# Patient Record
Sex: Female | Born: 1955 | Race: Black or African American | Hispanic: No | Marital: Single | Smoking: Never smoker
Health system: Southern US, Community
[De-identification: ages and names within clinical notes are randomized; demographics above are authoritative.]

## PROBLEM LIST (undated history)

## (undated) DIAGNOSIS — L709 Acne, unspecified: Secondary | ICD-10-CM

## (undated) DIAGNOSIS — C541 Malignant neoplasm of endometrium: Secondary | ICD-10-CM

## (undated) DIAGNOSIS — L309 Dermatitis, unspecified: Secondary | ICD-10-CM

## (undated) HISTORY — DX: Dermatitis, unspecified: L30.9

## (undated) HISTORY — DX: Acne, unspecified: L70.9

## (undated) HISTORY — PX: TUBAL LIGATION: SHX77

---

## 1999-09-30 ENCOUNTER — Encounter: Payer: Self-pay | Admitting: Family Medicine

## 1999-09-30 ENCOUNTER — Inpatient Hospital Stay (HOSPITAL_COMMUNITY): Admission: AD | Admit: 1999-09-30 | Discharge: 1999-09-30 | Payer: Self-pay | Admitting: Family Medicine

## 2000-01-28 ENCOUNTER — Ambulatory Visit (HOSPITAL_COMMUNITY): Admission: RE | Admit: 2000-01-28 | Discharge: 2000-01-28 | Payer: Self-pay | Admitting: *Deleted

## 2001-08-20 ENCOUNTER — Other Ambulatory Visit: Admission: RE | Admit: 2001-08-20 | Discharge: 2001-08-20 | Payer: Self-pay | Admitting: *Deleted

## 2001-12-10 ENCOUNTER — Ambulatory Visit (HOSPITAL_COMMUNITY): Admission: RE | Admit: 2001-12-10 | Discharge: 2001-12-10 | Payer: Self-pay | Admitting: Family Medicine

## 2001-12-10 ENCOUNTER — Encounter: Payer: Self-pay | Admitting: Family Medicine

## 2002-08-26 ENCOUNTER — Other Ambulatory Visit: Admission: RE | Admit: 2002-08-26 | Discharge: 2002-08-26 | Payer: Self-pay | Admitting: *Deleted

## 2003-09-01 ENCOUNTER — Other Ambulatory Visit: Admission: RE | Admit: 2003-09-01 | Discharge: 2003-09-01 | Payer: Self-pay | Admitting: Obstetrics and Gynecology

## 2004-06-24 ENCOUNTER — Emergency Department (HOSPITAL_COMMUNITY): Admission: EM | Admit: 2004-06-24 | Discharge: 2004-06-24 | Payer: Self-pay | Admitting: Emergency Medicine

## 2004-07-19 ENCOUNTER — Ambulatory Visit (HOSPITAL_COMMUNITY): Admission: RE | Admit: 2004-07-19 | Discharge: 2004-07-19 | Payer: Self-pay | Admitting: Family Medicine

## 2006-05-12 IMAGING — CR DG ABDOMEN ACUTE W/ 1V CHEST
3 series · 3 of 3 positions shown · non-contrast
Comparison: none

CLINICAL DATA: Abdominal bloating and constipation.
 ACUTE ABDOMINAL STUDY WITH CHEST ? 07/19/04 AT 1447 HOURS:

[view not recorded (1 of 3)]
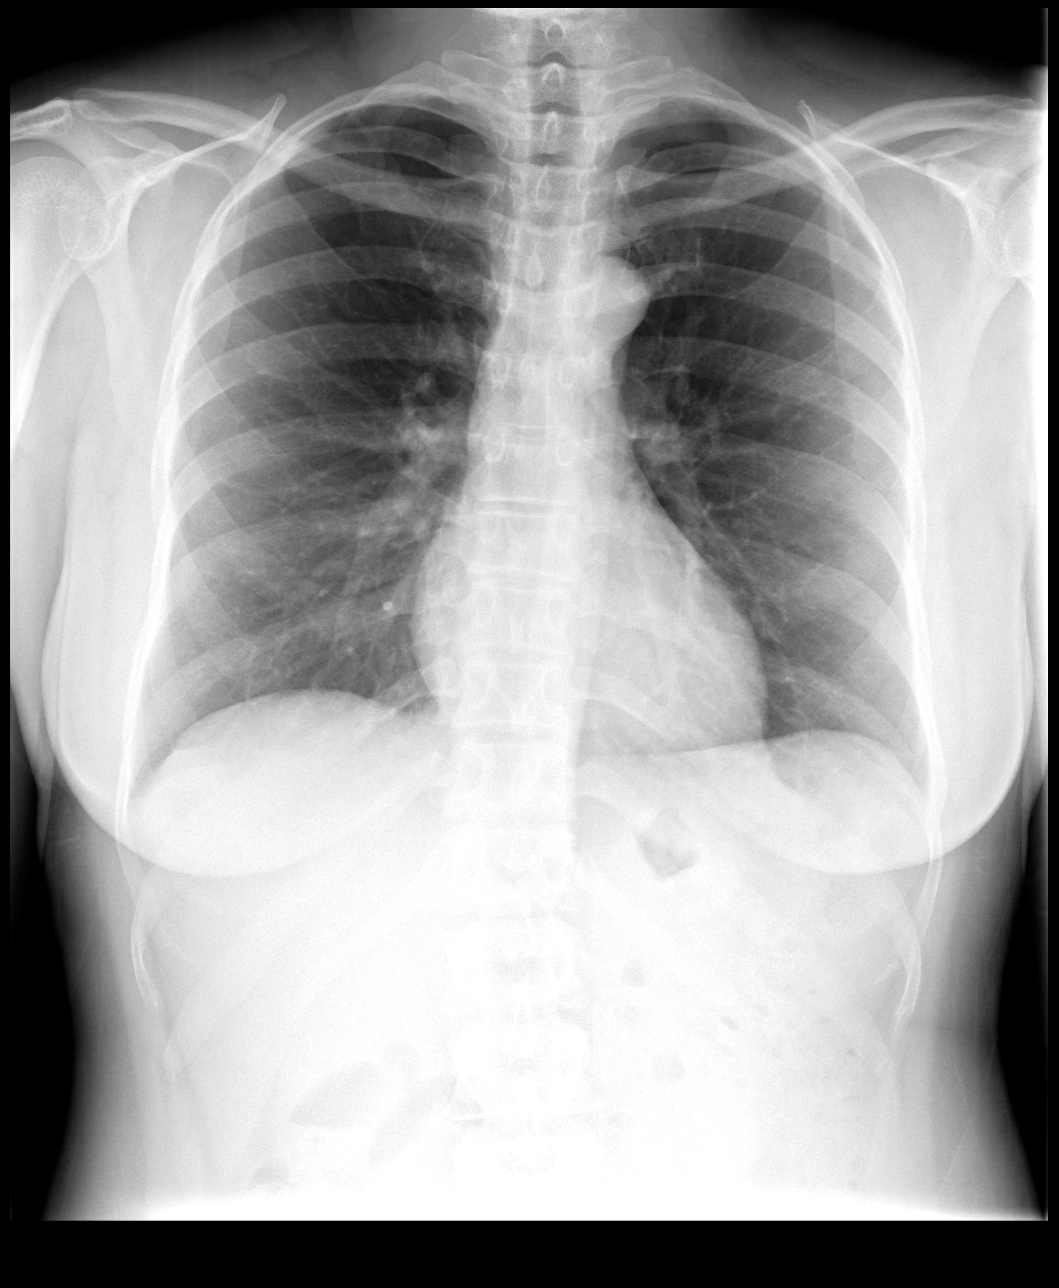

[view not recorded (2 of 3)]
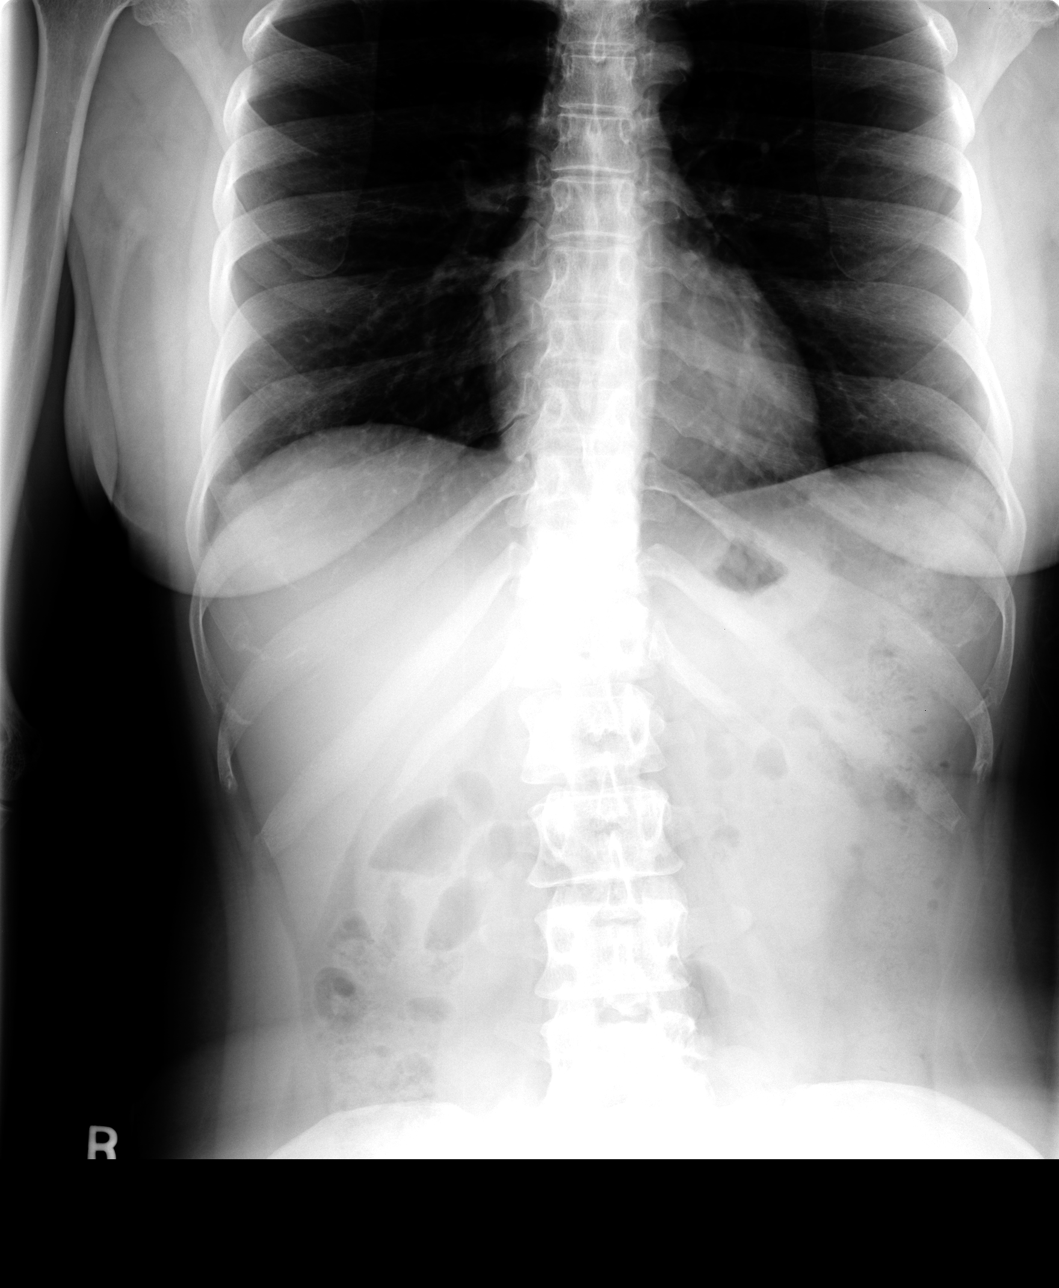

[view not recorded (3 of 3)]
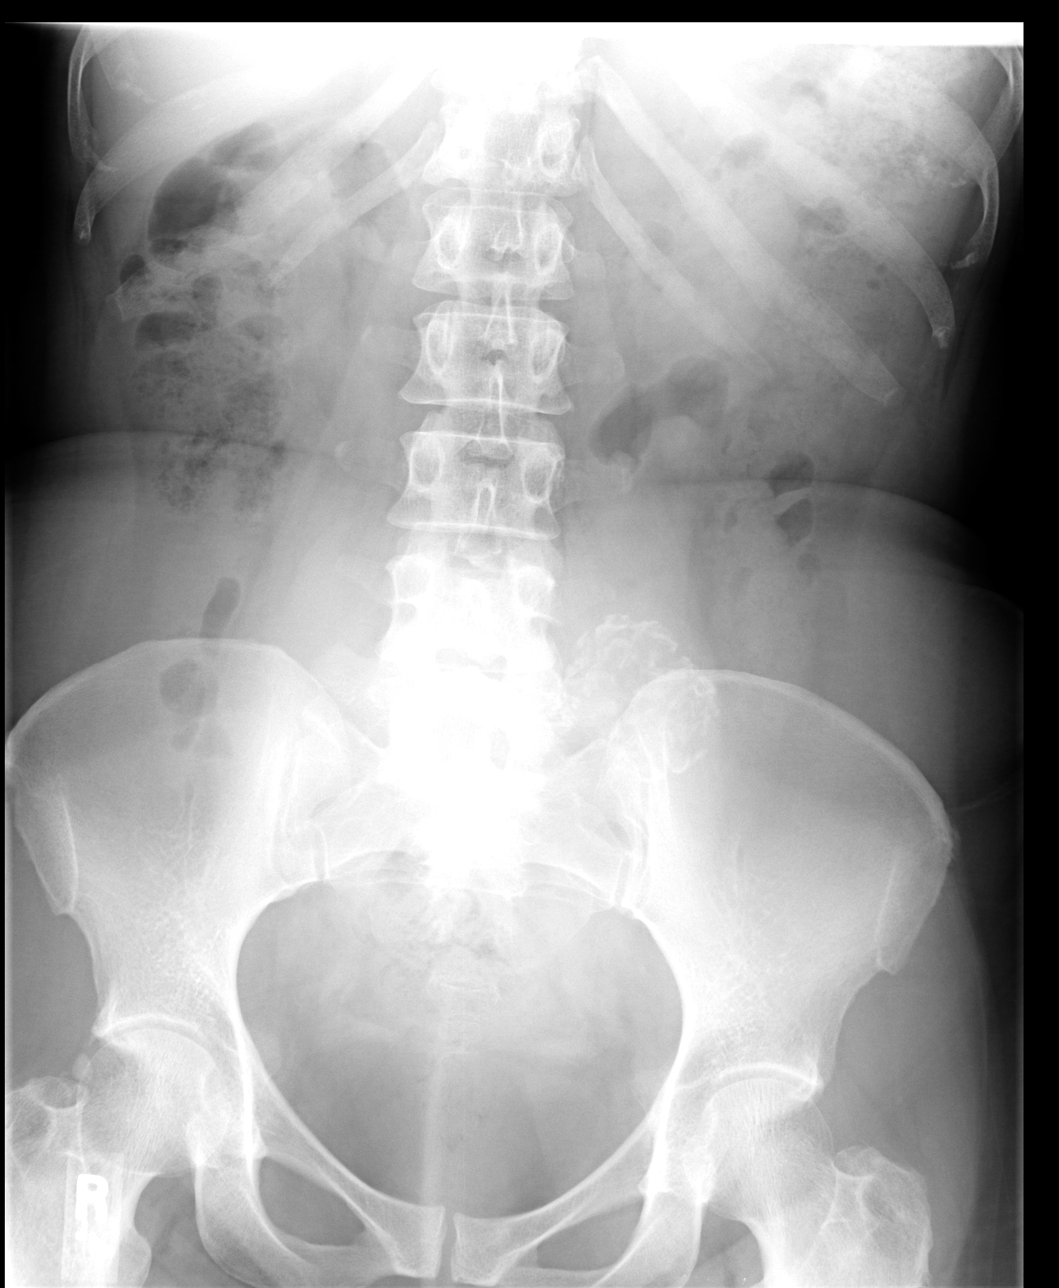

[3 of 3 positions shown; findings below may reference images not displayed]

FINDINGS: The lungs are clear.
 Gas and stool are seen in the colon.  There are no disproportionately dilated loops of bowel or air-fluid levels.  There is no free peritoneal gas.  A calcified 4-5 cm mass projects over the left lower quadrant favored to represent a calcified fibroid.  Other calcified intra-abdominal masses are not excluded.  Further characterization with ultrasound or CT can be performed.
IMPRESSION: 1.  Normal bowel gas pattern.
 2.  Calcified mass in the left pelvis.  Considerations would include calcified fibroid or other calcified mass in the abdomen and pelvis.  Further characterization with ultrasound can be performed.

## 2008-07-01 ENCOUNTER — Ambulatory Visit: Payer: Self-pay | Admitting: Family Medicine

## 2008-07-16 ENCOUNTER — Encounter (INDEPENDENT_AMBULATORY_CARE_PROVIDER_SITE_OTHER): Payer: Self-pay | Admitting: *Deleted

## 2009-01-07 ENCOUNTER — Encounter: Payer: Self-pay | Admitting: Family Medicine

## 2009-11-23 ENCOUNTER — Ambulatory Visit: Payer: Self-pay | Admitting: Family Medicine

## 2009-11-23 DIAGNOSIS — IMO0001 Reserved for inherently not codable concepts without codable children: Secondary | ICD-10-CM | POA: Insufficient documentation

## 2009-11-23 DIAGNOSIS — R5383 Other fatigue: Secondary | ICD-10-CM

## 2009-11-23 DIAGNOSIS — R5381 Other malaise: Secondary | ICD-10-CM | POA: Insufficient documentation

## 2010-01-08 ENCOUNTER — Encounter: Payer: Self-pay | Admitting: Family Medicine

## 2010-04-30 ENCOUNTER — Encounter: Payer: Self-pay | Admitting: Family Medicine

## 2010-07-11 LAB — CONVERTED CEMR LAB
ALT: 17 units/L (ref 0–35)
ANA Titer 1: 1:320 {titer} — ABNORMAL HIGH
AST: 23 units/L (ref 0–37)
Albumin: 3.8 g/dL (ref 3.5–5.2)
Albumin: 4.2 g/dL (ref 3.5–5.2)
Alkaline Phosphatase: 45 units/L (ref 39–117)
Alkaline Phosphatase: 63 units/L (ref 39–117)
Anti Nuclear Antibody(ANA): POSITIVE — AB
BUN: 14 mg/dL (ref 6–23)
Basophils Absolute: 0 10*3/uL (ref 0.0–0.1)
Basophils Absolute: 0.1 10*3/uL (ref 0.0–0.1)
Basophils Relative: 1.1 % (ref 0.0–3.0)
Bilirubin, Direct: 0.1 mg/dL (ref 0.0–0.3)
CO2: 29 meq/L (ref 19–32)
CO2: 30 meq/L (ref 19–32)
Calcium: 9.7 mg/dL (ref 8.4–10.5)
Calcium: 9.8 mg/dL (ref 8.4–10.5)
Chloride: 103 meq/L (ref 96–112)
Chloride: 107 meq/L (ref 96–112)
Cholesterol: 198 mg/dL (ref 0–200)
Creatinine, Ser: 0.8 mg/dL (ref 0.4–1.2)
Creatinine, Ser: 0.9 mg/dL (ref 0.4–1.2)
Eosinophils Absolute: 0.1 10*3/uL (ref 0.0–0.7)
Eosinophils Absolute: 0.2 10*3/uL (ref 0.0–0.7)
Eosinophils Relative: 1.9 % (ref 0.0–5.0)
Ferritin: 9.7 ng/mL — ABNORMAL LOW (ref 10.0–291.0)
Folate: 16.1 ng/mL
GFR calc Af Amer: 97 mL/min
GFR calc non Af Amer: 80 mL/min
Glucose, Bld: 83 mg/dL (ref 70–99)
Glucose, Bld: 93 mg/dL (ref 70–99)
HCT: 31.6 % — ABNORMAL LOW (ref 36.0–46.0)
HDL: 53.2 mg/dL (ref >39.00)
HDL: 58.8 mg/dL (ref >39.0)
Hemoglobin: 10.1 g/dL — ABNORMAL LOW (ref 12.0–15.0)
Hemoglobin: 13.3 g/dL (ref 12.0–15.0)
Iron: 49 ug/dL (ref 42–145)
LDL Cholesterol: 103 mg/dL — ABNORMAL HIGH (ref 0–99)
Lymphocytes Relative: 33.9 % (ref 12.0–46.0)
Lymphocytes Relative: 35 % (ref 12.0–46.0)
MCHC: 32.1 g/dL (ref 30.0–36.0)
MCHC: 34.4 g/dL (ref 30.0–36.0)
MCV: 74.4 fL — ABNORMAL LOW (ref 78.0–100.0)
MCV: 92.6 fL (ref 78.0–100.0)
Monocytes Absolute: 0.6 10*3/uL (ref 0.1–1.0)
Monocytes Absolute: 0.7 10*3/uL (ref 0.1–1.0)
Monocytes Relative: 9.9 % (ref 3.0–12.0)
Neutro Abs: 3 10*3/uL (ref 1.4–7.7)
Neutro Abs: 3.3 10*3/uL (ref 1.4–7.7)
Neutrophils Relative %: 52.1 % (ref 43.0–77.0)
Pap Smear: NORMAL
Pap Smear: NORMAL
Platelets: 306 10*3/uL (ref 150–400)
Potassium: 4 meq/L (ref 3.5–5.1)
RBC: 4.25 M/uL (ref 3.87–5.11)
RDW: 13.8 % (ref 11.5–14.6)
RDW: 16.6 % — ABNORMAL HIGH (ref 11.5–14.6)
Sodium: 137 meq/L (ref 135–145)
Sodium: 142 meq/L (ref 135–145)
TSH: 0.97 microintl units/mL (ref 0.35–5.50)
TSH: 1.24 microintl units/mL (ref 0.35–5.50)
Total Bilirubin: 0.9 mg/dL (ref 0.3–1.2)
Total CHOL/HDL Ratio: 3.4
Total Protein: 7.4 g/dL (ref 6.0–8.3)
Total Protein: 7.7 g/dL (ref 6.0–8.3)
Triglycerides: 180 mg/dL — ABNORMAL HIGH (ref 0–149)
Triglycerides: 77 mg/dL (ref 0.0–149.0)
VLDL: 36 mg/dL (ref 0–40)
Vit D, 25-Hydroxy: 35 ng/mL (ref 30–89)
Vitamin B-12: 598 pg/mL (ref 211–911)
WBC: 5.8 10*3/uL (ref 4.5–10.5)

## 2010-07-13 NOTE — Assessment & Plan Note (Signed)
Summary: cpe & lab/cbs   Vital Signs:  Patient profile:   55 year old female Height:      60.75 inches Weight:      159 pounds BMI:     30.40 Pulse rate:   86 / minute Pulse rhythm:   regular BP sitting:   110 / 70  (left arm) Cuff size:   regular  Vitals Entered By: Army Fossa CMA (November 23, 2009 8:32 AM) CC: CPX, labwork, no pap   History of Present Illness: Pt here for cpe, labs --- pt sees Dr Rosemary Holms for gyn exam ..   Pt only complaints is feeling tired all the time and muscle aches in legs.  No other complaints.  Preventive Screening-Counseling & Management  Alcohol-Tobacco     Alcohol drinks/day: 0     Smoking Status: never     Passive Smoke Exposure: no  Caffeine-Diet-Exercise     Caffeine use/day: 2     Does Patient Exercise: yes     Type of exercise: walking,biking     Exercise (avg: min/session): 30-60     Times/week: <3  Hep-HIV-STD-Contraception     HIV Risk: no     Dental Visit-last 6 months yes     Dental Care Counseling: not indicated; dental care within six months     SBE monthly: no     SBE Education/Counseling: to perform regular SBE     Sun Exposure-Excessive: occasionally  Safety-Violence-Falls     Seat Belt Use: 100      Sexual History:  currently monogamous and single.        Drug Use:  never.    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  Past Medical History: Reviewed history from 07/01/2008 and no changes required. none  Past Surgical History: Reviewed history from 07/01/2008 and no changes required. Tubal ligation  Family History: Reviewed history from 07/01/2008 and no changes required. breast CA-sister,aunt  Social History: Reviewed history from 07/01/2008 and no changes required. Occupation:  Accounting --Scientist, physiological Single Never Smoked Alcohol use-no Drug use-no Regular exercise-yes Dental Care w/in 6 mos.:  yes Sexual History:  currently monogamous, single Drug Use:  never Ethnicity:  Black  Review of Systems      See HPI General:  Denies chills, fatigue, fever, loss of appetite, malaise, sleep disorder, sweats, weakness, and weight loss. Eyes:  Denies blurring, discharge, double vision, eye irritation, eye pain, halos, itching, light sensitivity, red eye, vision loss-1 eye, and vision loss-both eyes; optho q1y. ENT:  Denies decreased hearing, difficulty swallowing, ear discharge, earache, hoarseness, nasal congestion, nosebleeds, postnasal drainage, ringing in ears, sinus pressure, and sore throat. CV:  Denies bluish discoloration of lips or nails, chest pain or discomfort, difficulty breathing at night, difficulty breathing while lying down, fainting, fatigue, leg cramps with exertion, lightheadness, near fainting, palpitations, shortness of breath with exertion, swelling of feet, swelling of hands, and weight gain. Resp:  Denies chest discomfort, chest pain with inspiration, cough, coughing up blood, excessive snoring, hypersomnolence, morning  headaches, pleuritic, shortness of breath, sputum productive, and wheezing. GI:  Denies abdominal pain, bloody stools, change in bowel habits, constipation, dark tarry stools, diarrhea, excessive appetite, gas, hemorrhoids, indigestion, loss of appetite, and nausea. GU:  Denies abnormal vaginal bleeding, decreased libido, discharge, dysuria,  hematuria, incontinence, nocturia, urinary frequency, and urinary hesitancy. MS:  Denies joint pain, joint redness, joint swelling, loss of strength, low back pain, mid back pain, muscle aches, muscle , cramps, muscle weakness, stiffness, and thoracic pain. Derm:  Denies changes in color of skin, changes in nail beds, dryness, excessive perspiration, flushing, hair loss, insect bite(s), itching, lesion(s), poor wound healing, and rash. Neuro:  Denies brief paralysis, difficulty with concentration, disturbances in coordination, falling down, headaches, inability to speak, memory loss, numbness, poor balance, seizures, sensation of room spinning, tingling, tremors, visual disturbances, and weakness. Psych:  Denies alternate hallucination ( auditory/visual), anxiety, depression, easily angered, easily tearful, irritability, mental problems, panic attacks, sense of great danger, suicidal thoughts/plans, thoughts of violence, unusual visions or sounds, and thoughts /plans of harming others. Endo:  Denies cold intolerance, excessive hunger, excessive thirst, excessive urination, heat intolerance, polyuria, and weight change. Heme:  Denies abnormal bruising, bleeding, enlarge lymph nodes, fevers, pallor, and skin discoloration. Allergy:  Denies hives or rash, itching eyes, persistent infections, seasonal allergies, and sneezing.  Physical Exam  General:  Well-developed,well-nourished,in no acute distress; alert,appropriate and cooperative throughout examination Head:  Normocephalic and atraumatic without obvious abnormalities. No apparent alopecia or balding. Eyes:  vision grossly intact, pupils equal, pupils round, pupils reactive to light, and no injection.   Ears:  External ear exam shows no significant lesions or deformities.  Otoscopic examination reveals clear canals, tympanic membranes are intact bilaterally without bulging, retraction, inflammation or discharge. Hearing is grossly normal  bilaterally. Nose:  External nasal examination shows no deformity or inflammation. Nasal mucosa are pink and moist without lesions or exudates. Mouth:  Oral mucosa and oropharynx without lesions or exudates.  Teeth in good repair. Neck:  No deformities, masses, or tenderness noted.no carotid bruits.   Breasts:  gyn Lungs:  Normal respiratory effort, chest expands symmetrically. Lungs are clear to auscultation, no crackles or wheezes. Heart:  normal rate and no murmur.   Abdomen:  Bowel sounds positive,abdomen soft and non-tender without masses, organomegaly or hernias noted. Rectal:  gyn Genitalia:  gyn Msk:  normal ROM, no joint tenderness, no joint swelling, no joint warmth, no redness over joints, no joint deformities, no joint instability, and no crepitation.   Pulses:  R posterior tibial normal, R dorsalis pedis normal, R carotid normal, L posterior tibial normal, L dorsalis pedis normal, and L carotid normal.   Extremities:  No clubbing, cyanosis, edema, or deformity noted with normal full range of motion of all joints.   Neurologic:  No cranial nerve deficits noted. Station and gait are normal. Plantar reflexes are down-going bilaterally. DTRs are symmetrical throughout. Sensory, motor and coordinative functions appear intact. Skin:  Intact without suspicious lesions or rashes Cervical Nodes:  No lymphadenopathy noted Axillary Nodes:  No palpable lymphadenopathy Psych:  Cognition and judgment appear intact. Alert and cooperative with normal attention span and concentration. No apparent delusions, illusions, hallucinations   Impression & Recommendations:  Problem # 1:  PREVENTIVE HEALTH CARE (ICD-V70.0) take 1500 Orders: Venipuncture (96295) TLB-Lipid Panel (80061-LIPID) TLB-BMP (Basic Metabolic Panel-BMET) (80048-METABOL) TLB-CBC Platelet - w/Differential (85025-CBCD) TLB-Hepatic/Liver Function Pnl (80076-HEPATIC) TLB-TSH (Thyroid Stimulating Hormone) (84443-TSH) TLB-B12 +  Folate Pnl (28413_24401-U27/OZD) TLB-IBC Pnl (Iron/FE;Transferrin) (83550-IBC) TLB-Ferritin (82728-FER) EKG w/  Interpretation (93000)  Problem # 2:  MYALGIA (ICD-729.1)  Orders: T-Vitamin D (25-Hydroxy) (96045-40981) T-Antinuclear Antib (ANA) (19147-82956) TLB-Rheumatoid Factor (RA) (21308-MV) TLB-Sedimentation Rate (ESR) (85652-ESR) EKG w/ Interpretation (93000)  Problem # 3:  FATIGUE (ICD-780.79)  Orders: TLB-B12 + Folate Pnl (78469_62952-W41/LKG) TLB-IBC Pnl (Iron/FE;Transferrin) (83550-IBC) TLB-Ferritin (82728-FER) EKG w/ Interpretation (93000) yyyyy  Patient Instructions: 1)  It is important that you exercise reguarly at least 20 minutes 5 times a week. If you develop chest pain, have severe difficulty breathing, or feel very tired, stop exercising immediately and seek medical attention.  2)  Take calcium +vitamin D daily. ---- 1200-1500 mg calcium and 1000u vita D3   EKG  Procedure date:  11/23/2009  Findings:      Normal sinus rhythm with rate of:  63 bpm   Last PAP:  Normal (08/29/2007 10:38:23 AM) PAP Result Date:  11/26/2008 PAP Result:  normal PAP Next Due:  1 yr Last Mammogram:  Normal Bilateral (10/24/2007 10:38:53 AM) Mammogram Result Date:  11/26/2008 Mammogram Result:  normal Mammogram Next Due:  1 yr  Appended Document: cpe & lab/cbs  Laboratory Results   Urine Tests   Date/Time Reported: November 23, 2009 10:14 AM   Routine Urinalysis   Color: yellow Appearance: Clear Glucose: negative   (Normal Range: Negative) Bilirubin: negative   (Normal Range: Negative) Ketone: negative   (Normal Range: Negative) Spec. Gravity: 1.020   (Normal Range: 1.003-1.035) Blood: negative   (Normal Range: Negative) pH: 6.5   (Normal Range: 5.0-8.0) Protein: negative   (Normal Range: Negative) Urobilinogen: negative   (Normal Range: 0-1) Nitrite: negative   (Normal Range: Negative) Leukocyte Esterace: negative   (Normal Range: Negative)    Comments: Floydene Flock  November 23, 2009 10:14 AM

## 2010-07-16 NOTE — Consult Note (Signed)
Summary: [facility]  [facility]   Imported By: Lanelle Bal 05/15/2010 11:34:02  _____________________________________________________________________  External Attachment:    Type:   Image     Comment:   External Document

## 2010-10-29 NOTE — Op Note (Signed)
[facility]  Patient:    Elizabeth [name], Elizabeth [name]                      MRN: [id number] Proc. Date: 01/28/00 Adm. Date:  [id number] Disc. Date: [id number] Attending:  Pleas Koch                           Operative Report  PREOPERATIVE DIAGNOSES:       1. Fibroid uterus.                               2. Desires elective sterilization.  POSTOPERATIVE DIAGNOSES:      1. Fibroid uterus.                               2. Desires elective sterilization.  OPERATION:                    Open laparoscopic tubal ligation.  SURGEON:                      Georgina Peer, M.D.  ANESTHESIA:                   General.  ESTIMATED BLOOD LOSS:         Less than 25 cc.  COMPLICATIONS:                None.  INDICATIONS:                  This is a 55 year old gravida 2, para 1, who desires no further children.  She has a history of fibroids which are approximately 14-16 weeks size, however, the patient is having no symptoms, no pressure, no abnormal periods and no pain.  She therefore did not wish to wait to see if the fibroids would become symptomatic.  She did desire tubal ligation knowing that she might have to have surgery again in the future.  DESCRIPTION OF PROCEDURE:     The patient was taken to the operating room and given a general anesthetic, placed in the dorsal lithotomy position.  She was prepped and draped in the normal sterile manner.  A uterine manipulator was placed through the uterus.  The uterus was about 16 weeks size.  Her bladder was emptied with a catheter.  A curvilinear subumbilical incision was made after 5 cc of 0.25% Marcaine had been injected into the skin. The subcutaneous tissue was transected.  The fascia was identified, elevated and transected. The peritoneum was identified and transected. The Hasson laparoscopic trocar was placed.  There were no adhesions around the abdominal wall. Pneumoperitoneum was created.  There were multiple  fibroids upon visualization of the pelvic structures.  The left tube was traced to its fimbriated end, cauterized until blanching and no current passed through the tube.  The right tube was convoluted and attached to the ovary posteriorly with thin filmy adhesions.  It was freed.  The fimbriated tube was identified, the mid portion elevated, cauterized to blanching, and no current passed through the tube along the 2 cm segments. Photodocumentation of this was made.  A second suprapubic port was made to aid in manipulation of the fibroid uterus. Multiple fibroids were noted.  The ovaries did not appear enlarged.  There was no  other pathology noted.  The procedure was terminated with the scope and trocars removed.  The fascia was closed with 0 Vicryl and the subcuticular Dexon suture to close the skin incisions.  The vaginal instruments were removed.  The patient had a normal sponge, needle and instrument count which was correct. She was returned to the recovery area in stable condition.DD: 01/28/00 TD:  01/30/00 Job: [id number]

## 2010-11-24 ENCOUNTER — Encounter: Payer: Self-pay | Admitting: Family Medicine

## 2010-11-29 ENCOUNTER — Ambulatory Visit (INDEPENDENT_AMBULATORY_CARE_PROVIDER_SITE_OTHER): Payer: 59 | Admitting: Family Medicine

## 2010-11-29 ENCOUNTER — Encounter: Payer: Self-pay | Admitting: Family Medicine

## 2010-11-29 VITALS — BP 118/82 | HR 68 | Temp 97.2°F | Ht 61.0 in | Wt 164.6 lb

## 2010-11-29 DIAGNOSIS — Z0000 Encounter for general adult medical examination without abnormal findings: Secondary | ICD-10-CM

## 2010-11-29 DIAGNOSIS — D649 Anemia, unspecified: Secondary | ICD-10-CM | POA: Insufficient documentation

## 2010-11-29 DIAGNOSIS — R42 Dizziness and giddiness: Secondary | ICD-10-CM | POA: Insufficient documentation

## 2010-11-29 LAB — IBC PANEL
Iron: 134 ug/dL (ref 42–145)
Transferrin: 305.7 mg/dL (ref 212.0–360.0)

## 2010-11-29 LAB — CBC WITH DIFFERENTIAL/PLATELET
Basophils Absolute: 0 10*3/uL (ref 0.0–0.1)
Eosinophils Absolute: 0.1 10*3/uL (ref 0.0–0.7)
Lymphocytes Relative: 43 % (ref 12.0–46.0)
MCHC: 34.5 g/dL (ref 30.0–36.0)
Neutro Abs: 2.7 10*3/uL (ref 1.4–7.7)
Neutrophils Relative %: 44.5 % (ref 43.0–77.0)
RDW: 12.3 % (ref 11.5–14.6)

## 2010-11-29 LAB — BASIC METABOLIC PANEL
Calcium: 9.6 mg/dL (ref 8.4–10.5)
GFR: 103.14 mL/min (ref >60.00)
Potassium: 4.1 mEq/L (ref 3.5–5.1)
Sodium: 139 mEq/L (ref 135–145)

## 2010-11-29 LAB — POCT URINALYSIS DIPSTICK
Bilirubin, UA: NEGATIVE
Blood, UA: NEGATIVE
Glucose, UA: NEGATIVE
Ketones, UA: NEGATIVE
Leukocytes, UA: NEGATIVE
Nitrite, UA: NEGATIVE
pH, UA: 7

## 2010-11-29 LAB — VITAMIN B12: Vitamin B-12: 271 pg/mL (ref 211–911)

## 2010-11-29 LAB — HEPATIC FUNCTION PANEL
Albumin: 4.6 g/dL (ref 3.5–5.2)
Alkaline Phosphatase: 68 U/L (ref 39–117)
Bilirubin, Direct: 0.2 mg/dL (ref 0.0–0.3)

## 2010-11-29 LAB — LIPID PANEL
HDL: 52.6 mg/dL (ref >39.00)
VLDL: 23.2 mg/dL (ref 0.0–40.0)

## 2010-11-29 NOTE — Patient Instructions (Signed)

## 2010-11-29 NOTE — Progress Notes (Signed)
Subjective:     Elizabeth [name] is a 55 y.o. female and is here for a comprehensive physical exam. The patient reports no problems.  History   Social History  . Marital Status: Single    Spouse Name: N/A    Number of Children: 1  . 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 Smoking status: Never Smoker   . Smokeless tobacco: Never Used  . Alcohol Use: No  . Drug Use: No  . Sexually Active: No   Other Topics Concern  . Not on file   Social History Narrative  . No narrative on file   Health Maintenance  Topic Date Due  . Pap Smear  10/21/1973  . Mammogram  10/21/2005  . Colonoscopy  10/21/2005  . Influenza Vaccine  03/14/2011  . Tetanus/tdap  05/26/2015    The following portions of the patient's history were reviewed and updated as appropriate: allergies, current medications, past family history, past medical history, past social history, past surgical history and problem list. Past Medical History  Diagnosis Date  . Acne    Past Surgical History  Procedure Date  . Tubal ligation    History   Social History  . Marital Status: Single    Spouse Name: N/A    Number of Children: 1  . 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 Smoking status: Never Smoker   . Smokeless tobacco: Never Used  . Alcohol Use: No  . Drug Use: No  . Sexually Active: No   Other Topics Concern  . Not on file   Social History Narrative  . No narrative on file   Family History  Problem Relation Age of Onset  . Breast cancer Sister   . Cancer Sister 25    breast  . Breast cancer Maternal Aunt   . Hepatitis Mother 53    hep C  . Heart disease Father 72    MI  . Aneurysm Maternal Grandfather        Review of Systems Review of Systems  Constitutional: Negative for activity change, appetite change and fatigue.  HENT: Negative for hearing  loss, congestion, tinnitus and ear discharge.  dentist q42m Eyes: Negative for visual disturbance (see optho q1y -- vision corrected to 20/20 with glasses).  Respiratory: Negative for cough, chest tightness and shortness of breath.   Cardiovascular: Negative for chest pain, palpitations and leg swelling.  Gastrointestinal: Negative for abdominal pain, diarrhea, constipation and abdominal distention.  Genitourinary: Negative for urgency, frequency, decreased urine volume and difficulty urinating.  Musculoskeletal: Negative for back pain, arthralgias and gait problem.  Skin: Negative for color change, pallor and rash.  Neurological: Negative for dizziness, light-headedness, numbness and headaches.  Hematological: Negative for adenopathy. Does not bruise/bleed easily.  Psychiatric/Behavioral: Negative for suicidal ideas, confusion, sleep disturbance, self-injury, dysphoric mood, decreased concentration and agitation.  Derm--[facility]-Dr Rosemary Holms    Objective:    BP 118/82  Pulse 68  Temp(Src) 97.2 F (36.2 C) (Oral)  Ht 5\' 1"  (1.549 m)  Wt 164 lb 9.6 oz (74.662 kg)  BMI 31.10 kg/m2  SpO2 97% General appearance: alert, cooperative, appears stated age and no distress Head: Normocephalic, without obvious abnormality, atraumatic Eyes: conjunctivae/corneas clear. PERRL, EOM's intact. Fundi benign. Ears: normal TM's and external ear canals both ears Nose: Nares normal. Septum midline. Mucosa normal.  No drainage or sinus tenderness. Throat: lips, mucosa, and tongue normal; teeth and gums normal Neck: no adenopathy, no carotid bruit, no JVD, supple, symmetrical, trachea midline and thyroid not enlarged, symmetric, no tenderness/mass/nodules Lungs: clear to auscultation bilaterally Breasts: gyn Heart: regular rate and rhythm, S1, S2 normal, no murmur, click, rub or gallop Abdomen: soft, non-tender; bowel sounds normal; no masses,  no organomegaly Pelvic: gyn Extremities: extremities normal,  atraumatic, no cyanosis or edema Pulses: 2+ and symmetric Skin: Skin color, texture, turgor normal. No rashes or lesions Lymph nodes: Cervical, supraclavicular, and axillary nodes normal. Neurologic: Grossly normal psych--no depression , no anxiety,  not suicidal    Assessment:    Healthy female exam.     acne--per derm Dizziness--check labs---hx anemia Plan:    ghm utd Check fasting labs See After Visit Summary for Counseling Recommendations

## 2010-12-06 ENCOUNTER — Encounter: Payer: Self-pay | Admitting: *Deleted

## 2010-12-13 ENCOUNTER — Encounter: Payer: Self-pay | Admitting: Family Medicine

## 2011-01-28 ENCOUNTER — Encounter: Payer: Self-pay | Admitting: Family Medicine

## 2011-10-25 DIAGNOSIS — Q178 Other specified congenital malformations of ear: Secondary | ICD-10-CM | POA: Insufficient documentation

## 2012-01-26 ENCOUNTER — Encounter: Payer: Self-pay | Admitting: Family Medicine

## 2012-04-03 ENCOUNTER — Encounter: Payer: Self-pay | Admitting: Family Medicine

## 2012-04-10 ENCOUNTER — Encounter: Payer: Self-pay | Admitting: Family Medicine

## 2012-04-10 ENCOUNTER — Ambulatory Visit (INDEPENDENT_AMBULATORY_CARE_PROVIDER_SITE_OTHER): Payer: 59 | Admitting: Family Medicine

## 2012-04-10 VITALS — BP 120/82 | HR 66 | Temp 98.0°F | Ht 61.0 in | Wt 168.6 lb

## 2012-04-10 DIAGNOSIS — Z0000 Encounter for general adult medical examination without abnormal findings: Secondary | ICD-10-CM

## 2012-04-10 LAB — BASIC METABOLIC PANEL
Chloride: 102 mEq/L (ref 96–112)
Potassium: 3.2 mEq/L — ABNORMAL LOW (ref 3.5–5.1)

## 2012-04-10 LAB — CBC WITH DIFFERENTIAL/PLATELET
Basophils Relative: 0.7 % (ref 0.0–3.0)
Eosinophils Relative: 2.1 % (ref 0.0–5.0)
HCT: 43 % (ref 36.0–46.0)
Lymphs Abs: 2.4 10*3/uL (ref 0.7–4.0)
MCV: 91.3 fl (ref 78.0–100.0)
Monocytes Absolute: 0.6 10*3/uL (ref 0.1–1.0)
Neutrophils Relative %: 48.3 % (ref 43.0–77.0)
RBC: 4.71 Mil/uL (ref 3.87–5.11)
WBC: 6.1 10*3/uL (ref 4.5–10.5)

## 2012-04-10 LAB — HEPATIC FUNCTION PANEL
ALT: 36 U/L — ABNORMAL HIGH (ref 0–35)
AST: 38 U/L — ABNORMAL HIGH (ref 0–37)
Bilirubin, Direct: 0.2 mg/dL (ref 0.0–0.3)
Total Protein: 8.2 g/dL (ref 6.0–8.3)

## 2012-04-10 LAB — LIPID PANEL: Triglycerides: 103 mg/dL (ref 0.0–149.0)

## 2012-04-10 LAB — TSH: TSH: 0.67 u[IU]/mL (ref 0.35–5.50)

## 2012-04-10 NOTE — Patient Instructions (Signed)
Preventive Care for Adults, Female A healthy lifestyle and preventive care can promote health and wellness. Preventive health guidelines for women include the following key practices.  A routine yearly physical is a good way to check with your caregiver about your health and preventive screening. It is a chance to share any concerns and updates on your health, and to receive a thorough exam.  Visit your dentist for a routine exam and preventive care every 6 months. Brush your teeth twice a day and floss once a day. Good oral hygiene prevents tooth decay and gum disease.  The frequency of eye exams is based on your age, health, family medical history, use of contact lenses, and other factors. Follow your caregiver's recommendations for frequency of eye exams.  Eat a healthy diet. Foods like vegetables, fruits, whole grains, low-fat dairy products, and lean protein foods contain the nutrients you need without too many calories. Decrease your intake of foods high in solid fats, added sugars, and salt. Eat the right amount of calories for you.Get information about a proper diet from your caregiver, if necessary.  Regular physical exercise is one of the most important things you can do for your health. Most adults should get at least 150 minutes of moderate-intensity exercise (any activity that increases your heart rate and causes you to sweat) each week. In addition, most adults need muscle-strengthening exercises on 2 or more days a week.  Maintain a healthy weight. The body mass index (BMI) is a screening tool to identify possible weight problems. It provides an estimate of body fat based on height and weight. Your caregiver can help determine your BMI, and can help you achieve or maintain a healthy weight.For adults 20 years and older:  A BMI below 18.5 is considered underweight.  A BMI of 18.5 to 24.9 is normal.  A BMI of 25 to 29.9 is considered overweight.  A BMI of 30 and above is  considered obese.  Maintain normal blood lipids and cholesterol levels by exercising and minimizing your intake of saturated fat. Eat a balanced diet with plenty of fruit and vegetables. Blood tests for lipids and cholesterol should begin at age 20 and be repeated every 5 years. If your lipid or cholesterol levels are high, you are over 50, or you are at high risk for heart disease, you may need your cholesterol levels checked more frequently.Ongoing high lipid and cholesterol levels should be treated with medicines if diet and exercise are not effective.  If you smoke, find out from your caregiver how to quit. If you do not use tobacco, do not start.  If you are pregnant, do not drink alcohol. If you are breastfeeding, be very cautious about drinking alcohol. If you are not pregnant and choose to drink alcohol, do not exceed 1 drink per day. One drink is considered to be 12 ounces (355 mL) of beer, 5 ounces (148 mL) of wine, or 1.5 ounces (44 mL) of liquor.  Avoid use of street drugs. Do not share needles with anyone. Ask for help if you need support or instructions about stopping the use of drugs.  High blood pressure causes heart disease and increases the risk of stroke. Your blood pressure should be checked at least every 1 to 2 years. Ongoing high blood pressure should be treated with medicines if weight loss and exercise are not effective.  If you are 55 to 56 years old, ask your caregiver if you should take aspirin to prevent strokes.  Diabetes   screening involves taking a blood sample to check your fasting blood sugar level. This should be done once every 3 years, after age 45, if you are within normal weight and without risk factors for diabetes. Testing should be considered at a younger age or be carried out more frequently if you are overweight and have at least 1 risk factor for diabetes.  Breast cancer screening is essential preventive care for women. You should practice "breast  self-awareness." This means understanding the normal appearance and feel of your breasts and may include breast self-examination. Any changes detected, no matter how small, should be reported to a caregiver. Women in their 20s and 30s should have a clinical breast exam (CBE) by a caregiver as part of a regular health exam every 1 to 3 years. After age 40, women should have a CBE every year. Starting at age 40, women should consider having a mammography (breast X-ray test) every year. Women who have a family history of breast cancer should talk to their caregiver about genetic screening. Women at a high risk of breast cancer should talk to their caregivers about having magnetic resonance imaging (MRI) and a mammography every year.  The Pap test is a screening test for cervical cancer. A Pap test can show cell changes on the cervix that might become cervical cancer if left untreated. A Pap test is a procedure in which cells are obtained and examined from the lower end of the uterus (cervix).  Women should have a Pap test starting at age 21.  Between ages 21 and 29, Pap tests should be repeated every 2 years.  Beginning at age 30, you should have a Pap test every 3 years as long as the past 3 Pap tests have been normal.  Some women have medical problems that increase the chance of getting cervical cancer. Talk to your caregiver about these problems. It is especially important to talk to your caregiver if a new problem develops soon after your last Pap test. In these cases, your caregiver may recommend more frequent screening and Pap tests.  The above recommendations are the same for women who have or have not gotten the vaccine for human papillomavirus (HPV).  If you had a hysterectomy for a problem that was not cancer or a condition that could lead to cancer, then you no longer need Pap tests. Even if you no longer need a Pap test, a regular exam is a good idea to make sure no other problems are  starting.  If you are between ages 65 and 70, and you have had normal Pap tests going back 10 years, you no longer need Pap tests. Even if you no longer need a Pap test, a regular exam is a good idea to make sure no other problems are starting.  If you have had past treatment for cervical cancer or a condition that could lead to cancer, you need Pap tests and screening for cancer for at least 20 years after your treatment.  If Pap tests have been discontinued, risk factors (such as a new sexual partner) need to be reassessed to determine if screening should be resumed.  The HPV test is an additional test that may be used for cervical cancer screening. The HPV test looks for the virus that can cause the cell changes on the cervix. The cells collected during the Pap test can be tested for HPV. The HPV test could be used to screen women aged 30 years and older, and should   be used in women of any age who have unclear Pap test results. After the age of 30, women should have HPV testing at the same frequency as a Pap test.  Colorectal cancer can be detected and often prevented. Most routine colorectal cancer screening begins at the age of 50 and continues through age 75. However, your caregiver may recommend screening at an earlier age if you have risk factors for colon cancer. On a yearly basis, your caregiver may provide home test kits to check for hidden blood in the stool. Use of a small camera at the end of a tube, to directly examine the colon (sigmoidoscopy or colonoscopy), can detect the earliest forms of colorectal cancer. Talk to your caregiver about this at age 50, when routine screening begins. Direct examination of the colon should be repeated every 5 to 10 years through age 75, unless early forms of pre-cancerous polyps or small growths are found.  Hepatitis C blood testing is recommended for all people born from 1945 through 1965 and any individual with known risks for hepatitis C.  Practice  safe sex. Use condoms and avoid high-risk sexual practices to reduce the spread of sexually transmitted infections (STIs). STIs include gonorrhea, chlamydia, syphilis, trichomonas, herpes, HPV, and human immunodeficiency virus (HIV). Herpes, HIV, and HPV are viral illnesses that have no cure. They can result in disability, cancer, and death. Sexually active women aged 25 and younger should be checked for chlamydia. Older women with new or multiple partners should also be tested for chlamydia. Testing for other STIs is recommended if you are sexually active and at increased risk.  Osteoporosis is a disease in which the bones lose minerals and strength with aging. This can result in serious bone fractures. The risk of osteoporosis can be identified using a bone density scan. Women ages 65 and over and women at risk for fractures or osteoporosis should discuss screening with their caregivers. Ask your caregiver whether you should take a calcium supplement or vitamin D to reduce the rate of osteoporosis.  Menopause can be associated with physical symptoms and risks. Hormone replacement therapy is available to decrease symptoms and risks. You should talk to your caregiver about whether hormone replacement therapy is right for you.  Use sunscreen with sun protection factor (SPF) of 30 or more. Apply sunscreen liberally and repeatedly throughout the day. You should seek shade when your shadow is shorter than you. Protect yourself by wearing long sleeves, pants, a wide-brimmed hat, and sunglasses year round, whenever you are outdoors.  Once a month, do a whole body skin exam, using a mirror to look at the skin on your back. Notify your caregiver of new moles, moles that have irregular borders, moles that are larger than a pencil eraser, or moles that have changed in shape or color.  Stay current with required immunizations.  Influenza. You need a dose every fall (or winter). The composition of the flu vaccine  changes each year, so being vaccinated once is not enough.  Pneumococcal polysaccharide. You need 1 to 2 doses if you smoke cigarettes or if you have certain chronic medical conditions. You need 1 dose at age 65 (or older) if you have never been vaccinated.  Tetanus, diphtheria, pertussis (Tdap, Td). Get 1 dose of Tdap vaccine if you are younger than age 65, are over 65 and have contact with an infant, are a healthcare worker, are pregnant, or simply want to be protected from whooping cough. After that, you need a Td   booster dose every 10 years. Consult your caregiver if you have not had at least 3 tetanus and diphtheria-containing shots sometime in your life or have a deep or dirty wound.  HPV. You need this vaccine if you are a woman age 26 or younger. The vaccine is given in 3 doses over 6 months.  Measles, mumps, rubella (MMR). You need at least 1 dose of MMR if you were born in 1957 or later. You may also need a second dose.  Meningococcal. If you are age 19 to 21 and a first-year college student living in a residence hall, or have one of several medical conditions, you need to get vaccinated against meningococcal disease. You may also need additional booster doses.  Zoster (shingles). If you are age 60 or older, you should get this vaccine.  Varicella (chickenpox). If you have never had chickenpox or you were vaccinated but received only 1 dose, talk to your caregiver to find out if you need this vaccine.  Hepatitis A. You need this vaccine if you have a specific risk factor for hepatitis A virus infection or you simply wish to be protected from this disease. The vaccine is usually given as 2 doses, 6 to 18 months apart.  Hepatitis B. You need this vaccine if you have a specific risk factor for hepatitis B virus infection or you simply wish to be protected from this disease. The vaccine is given in 3 doses, usually over 6 months. Preventive Services / Frequency Ages 19 to 39  Blood  pressure check.** / Every 1 to 2 years.  Lipid and cholesterol check.** / Every 5 years beginning at age 20.  Clinical breast exam.** / Every 3 years for women in their 20s and 30s.  Pap test.** / Every 2 years from ages 21 through 29. Every 3 years starting at age 30 through age 65 or 70 with a history of 3 consecutive normal Pap tests.  HPV screening.** / Every 3 years from ages 30 through ages 65 to 70 with a history of 3 consecutive normal Pap tests.  Hepatitis C blood test.** / For any individual with known risks for hepatitis C.  Skin self-exam. / Monthly.  Influenza immunization.** / Every year.  Pneumococcal polysaccharide immunization.** / 1 to 2 doses if you smoke cigarettes or if you have certain chronic medical conditions.  Tetanus, diphtheria, pertussis (Tdap, Td) immunization. / A one-time dose of Tdap vaccine. After that, you need a Td booster dose every 10 years.  HPV immunization. / 3 doses over 6 months, if you are 26 and younger.  Measles, mumps, rubella (MMR) immunization. / You need at least 1 dose of MMR if you were born in 1957 or later. You may also need a second dose.  Meningococcal immunization. / 1 dose if you are age 19 to 21 and a first-year college student living in a residence hall, or have one of several medical conditions, you need to get vaccinated against meningococcal disease. You may also need additional booster doses.  Varicella immunization.** / Consult your caregiver.  Hepatitis A immunization.** / Consult your caregiver. 2 doses, 6 to 18 months apart.  Hepatitis B immunization.** / Consult your caregiver. 3 doses usually over 6 months. Ages 40 to 64  Blood pressure check.** / Every 1 to 2 years.  Lipid and cholesterol check.** / Every 5 years beginning at age 20.  Clinical breast exam.** / Every year after age 40.  Mammogram.** / Every year beginning at age 40   and continuing for as long as you are in good health. Consult with your  caregiver.  Pap test.** / Every 3 years starting at age 30 through age 65 or 70 with a history of 3 consecutive normal Pap tests.  HPV screening.** / Every 3 years from ages 30 through ages 65 to 70 with a history of 3 consecutive normal Pap tests.  Fecal occult blood test (FOBT) of stool. / Every year beginning at age 50 and continuing until age 75. You may not need to do this test if you get a colonoscopy every 10 years.  Flexible sigmoidoscopy or colonoscopy.** / Every 5 years for a flexible sigmoidoscopy or every 10 years for a colonoscopy beginning at age 50 and continuing until age 75.  Hepatitis C blood test.** / For all people born from 1945 through 1965 and any individual with known risks for hepatitis C.  Skin self-exam. / Monthly.  Influenza immunization.** / Every year.  Pneumococcal polysaccharide immunization.** / 1 to 2 doses if you smoke cigarettes or if you have certain chronic medical conditions.  Tetanus, diphtheria, pertussis (Tdap, Td) immunization.** / A one-time dose of Tdap vaccine. After that, you need a Td booster dose every 10 years.  Measles, mumps, rubella (MMR) immunization. / You need at least 1 dose of MMR if you were born in 1957 or later. You may also need a second dose.  Varicella immunization.** / Consult your caregiver.  Meningococcal immunization.** / Consult your caregiver.  Hepatitis A immunization.** / Consult your caregiver. 2 doses, 6 to 18 months apart.  Hepatitis B immunization.** / Consult your caregiver. 3 doses, usually over 6 months. Ages 65 and over  Blood pressure check.** / Every 1 to 2 years.  Lipid and cholesterol check.** / Every 5 years beginning at age 20.  Clinical breast exam.** / Every year after age 40.  Mammogram.** / Every year beginning at age 40 and continuing for as long as you are in good health. Consult with your caregiver.  Pap test.** / Every 3 years starting at age 30 through age 65 or 70 with a 3  consecutive normal Pap tests. Testing can be stopped between 65 and 70 with 3 consecutive normal Pap tests and no abnormal Pap or HPV tests in the past 10 years.  HPV screening.** / Every 3 years from ages 30 through ages 65 or 70 with a history of 3 consecutive normal Pap tests. Testing can be stopped between 65 and 70 with 3 consecutive normal Pap tests and no abnormal Pap or HPV tests in the past 10 years.  Fecal occult blood test (FOBT) of stool. / Every year beginning at age 50 and continuing until age 75. You may not need to do this test if you get a colonoscopy every 10 years.  Flexible sigmoidoscopy or colonoscopy.** / Every 5 years for a flexible sigmoidoscopy or every 10 years for a colonoscopy beginning at age 50 and continuing until age 75.  Hepatitis C blood test.** / For all people born from 1945 through 1965 and any individual with known risks for hepatitis C.  Osteoporosis screening.** / A one-time screening for women ages 65 and over and women at risk for fractures or osteoporosis.  Skin self-exam. / Monthly.  Influenza immunization.** / Every year.  Pneumococcal polysaccharide immunization.** / 1 dose at age 65 (or older) if you have never been vaccinated.  Tetanus, diphtheria, pertussis (Tdap, Td) immunization. / A one-time dose of Tdap vaccine if you are over   65 and have contact with an infant, are a healthcare worker, or simply want to be protected from whooping cough. After that, you need a Td booster dose every 10 years.  Varicella immunization.** / Consult your caregiver.  Meningococcal immunization.** / Consult your caregiver.  Hepatitis A immunization.** / Consult your caregiver. 2 doses, 6 to 18 months apart.  Hepatitis B immunization.** / Check with your caregiver. 3 doses, usually over 6 months. ** Family history and personal history of risk and conditions may change your caregiver's recommendations. Document Released: 07/26/2001 Document Revised: 08/22/2011  Document Reviewed: 10/25/2010 [employer] Patient Information 2013 [employer].  

## 2012-04-10 NOTE — Progress Notes (Signed)
Subjective:     Elizabeth [name] is a 56 y.o. female and is here for a comprehensive physical exam. The patient reports no problems.  History   Social History  . Marital Status: Single    Spouse Name: N/A    Number of Children: 1  . 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 Smoking status: Never Smoker   . Smokeless tobacco: Never Used  . Alcohol Use: No  . Drug Use: No  . Sexually Active: No   Other Topics Concern  . Not on file   Social History Narrative   Exercise--- no   Health Maintenance  Topic Date Due  . Influenza Vaccine  02/12/2012  . Mammogram  01/15/2014  . Pap Smear  04/10/2014  . Tetanus/tdap  05/26/2015  . Colonoscopy  06/30/2017    The following portions of the patient's history were reviewed and updated as appropriate:  She  has a past medical history of Acne and Eczema. She  does not have any pertinent problems on file. She  has past surgical history that includes Tubal ligation. Her family history includes Aneurysm in her maternal grandfather; Breast cancer in her maternal aunt and sister; Cancer (age of onset:52) in her sister; Heart disease (age of onset:65) in her father; and Hepatitis (age of onset:66) in her mother. She  reports that she has never smoked. She has never used smokeless tobacco. She reports that she does not drink alcohol or use illicit drugs. She has a current medication list which includes the following prescription(s): clobetasol cream and desonide. Current Outpatient Prescriptions on File Prior to Visit  Medication Sig Dispense Refill  . desonide (DESOWEN) 0.05 % lotion        She  has no known allergies..  Review of Systems Review of Systems  Constitutional: Negative for activity change, appetite change and fatigue.  HENT: Negative for hearing loss, congestion, tinnitus and ear discharge.  dentist q60m Eyes: Negative for visual disturbance (see optho q1y --  vision corrected to 20/20 with glasses).  Respiratory: Negative for cough, chest tightness and shortness of breath.   Cardiovascular: Negative for chest pain, palpitations and leg swelling.  Gastrointestinal: Negative for abdominal pain, diarrhea, constipation and abdominal distention.  Genitourinary: Negative for urgency, frequency, decreased urine volume and difficulty urinating.  Musculoskeletal: Negative for back pain, arthralgias and gait problem.  Skin: Negative for color change, pallor and rash.  Neurological: Negative for dizziness, light-headedness, numbness and headaches.  Hematological: Negative for adenopathy. Does not bruise/bleed easily.  Psychiatric/Behavioral: Negative for suicidal ideas, confusion, sleep disturbance, self-injury, dysphoric mood, decreased concentration and agitation.       Objective:    BP 120/82  Pulse 66  Temp 98 F (36.7 C) (Oral)  Ht 5\' 1"  (1.549 m)  Wt 168 lb 9.6 oz (76.476 kg)  BMI 31.86 kg/m2  SpO2 98% General appearance: alert, cooperative, appears stated age and no distress Head: Normocephalic, without obvious abnormality, atraumatic Eyes: conjunctivae/corneas clear. PERRL, EOM's intact. Fundi benign. Ears: normal TM's and external ear canals both ears Nose: Nares normal. Septum midline. Mucosa normal. No drainage or sinus tenderness. Throat: lips, mucosa, and tongue normal; teeth and gums normal Neck: no adenopathy, no carotid bruit, no JVD, supple, symmetrical, trachea midline and thyroid not enlarged, symmetric, no tenderness/mass/nodules Back: symmetric, no curvature. ROM normal. No CVA tenderness. Lungs: clear to auscultation bilaterally Breasts: normal appearance, no masses or tenderness Heart: regular  rate and rhythm, S1, S2 normal, no murmur, click, rub or gallop Abdomen: soft, non-tender; bowel sounds normal; no masses,  no organomegaly Pelvic: deferred---gyn Extremities: extremities normal, atraumatic, no cyanosis or  edema Pulses: 2+ and symmetric Skin: Skin color, texture, turgor normal. No rashes or lesions Lymph nodes: Cervical, supraclavicular, and axillary nodes normal. Neurologic: Alert and oriented X 3, normal strength and tone. Normal symmetric reflexes. Normal coordination and gait psych- no depression, no anxiety    Assessment:    Healthy female exam.      Plan:    ghm Check fasting labs See After Visit Summary for Counseling Recommendations

## 2012-05-14 ENCOUNTER — Encounter: Payer: 59 | Admitting: Family Medicine

## 2013-03-25 LAB — HM MAMMOGRAPHY

## 2013-03-28 ENCOUNTER — Encounter: Payer: Self-pay | Admitting: Family Medicine

## 2013-05-20 LAB — HM COLONOSCOPY: HM Colonoscopy: NORMAL

## 2013-06-04 LAB — HM PAP SMEAR

## 2013-06-05 ENCOUNTER — Telehealth: Payer: Self-pay

## 2013-06-05 NOTE — Telephone Encounter (Signed)
Left message for call back Non identifiable  Pap--03/2011--nml [facility]--03/2013 [facility]--ordered

## 2013-06-10 ENCOUNTER — Encounter: Payer: Self-pay | Admitting: Family Medicine

## 2013-06-10 ENCOUNTER — Ambulatory Visit (INDEPENDENT_AMBULATORY_CARE_PROVIDER_SITE_OTHER): Payer: 59 | Admitting: Family Medicine

## 2013-06-10 VITALS — BP 110/64 | HR 73 | Temp 98.1°F | Ht 61.0 in | Wt 175.0 lb

## 2013-06-10 DIAGNOSIS — R42 Dizziness and giddiness: Secondary | ICD-10-CM

## 2013-06-10 DIAGNOSIS — Z0000 Encounter for general adult medical examination without abnormal findings: Secondary | ICD-10-CM

## 2013-06-10 DIAGNOSIS — E669 Obesity, unspecified: Secondary | ICD-10-CM | POA: Insufficient documentation

## 2013-06-10 LAB — CBC WITH DIFFERENTIAL/PLATELET
Basophils Absolute: 0 10*3/uL (ref 0.0–0.1)
Basophils Relative: 0.8 % (ref 0.0–3.0)
Eosinophils Absolute: 0.1 10*3/uL (ref 0.0–0.7)
HCT: 40.3 % (ref 36.0–46.0)
Hemoglobin: 13.8 g/dL (ref 12.0–15.0)
Lymphocytes Relative: 38.1 % (ref 12.0–46.0)
MCHC: 34.2 g/dL (ref 30.0–36.0)
MCV: 89.6 fl (ref 78.0–100.0)
Monocytes Absolute: 0.5 10*3/uL (ref 0.1–1.0)
Monocytes Relative: 9.9 % (ref 3.0–12.0)
Neutrophils Relative %: 48.4 % (ref 43.0–77.0)
Platelets: 221 10*3/uL (ref 150.0–400.0)
RDW: 12.7 % (ref 11.5–14.6)
WBC: 5.3 10*3/uL (ref 4.5–10.5)

## 2013-06-10 LAB — BASIC METABOLIC PANEL
BUN: 18 mg/dL (ref 6–23)
CO2: 27 mEq/L (ref 19–32)
Calcium: 9.6 mg/dL (ref 8.4–10.5)
Creatinine, Ser: 0.7 mg/dL (ref 0.4–1.2)
Glucose, Bld: 87 mg/dL (ref 70–99)
Potassium: 3.9 mEq/L (ref 3.5–5.1)

## 2013-06-10 LAB — LIPID PANEL
HDL: 47.4 mg/dL (ref >39.00)
Total CHOL/HDL Ratio: 4
Triglycerides: 101 mg/dL (ref 0.0–149.0)
VLDL: 20.2 mg/dL (ref 0.0–40.0)

## 2013-06-10 LAB — HEPATIC FUNCTION PANEL
Albumin: 4.1 g/dL (ref 3.5–5.2)
Alkaline Phosphatase: 58 U/L (ref 39–117)
Bilirubin, Direct: 0.1 mg/dL (ref 0.0–0.3)

## 2013-06-10 LAB — POCT URINALYSIS DIPSTICK
Bilirubin, UA: NEGATIVE
Blood, UA: NEGATIVE
Ketones, UA: NEGATIVE
Leukocytes, UA: NEGATIVE
Protein, UA: NEGATIVE
pH, UA: 7.5

## 2013-06-10 LAB — TSH: TSH: 0.86 u[IU]/mL (ref 0.35–5.50)

## 2013-06-10 NOTE — Telephone Encounter (Signed)
Unable to reach pre visit.  

## 2013-06-10 NOTE — Progress Notes (Signed)
Pre visit review using our clinic review tool, if applicable. No additional management support is needed unless otherwise documented below in the visit note. 

## 2013-06-10 NOTE — Progress Notes (Signed)
Subjective:     Elizabeth [name] is a 57 y.o. female and is here for a comprehensive physical exam. The patient reports occasional dizzy spells--- she has notice it can be when she has gone several hours without eating.     History   Social History  . Marital Status: Single    Spouse Name: N/A    Number of Children: 1  . 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 Smoking status: Never Smoker   . Smokeless tobacco: Never Used  . Alcohol Use: No  . Drug Use: No  . Sexual Activity: No   Other Topics Concern  . Not on file   Social History Narrative   Exercise--- no   Health Maintenance  Topic Date Due  . Influenza Vaccine  06/10/2014  . Mammogram  03/26/2015  . Tetanus/tdap  05/26/2015  . Pap Smear  06/04/2016  . Colonoscopy  05/21/2023    The following portions of the patient's history were reviewed and updated as appropriate:  She  has a past medical history of Acne and Eczema. She  does not have any pertinent problems on file. She  has past surgical history that includes Tubal ligation. Her family history includes Aneurysm in her maternal grandfather; Breast cancer in her maternal aunt and sister; Cancer (age of onset: 13) in her sister; Heart disease (age of onset: 46) in her father; Hepatitis (age of onset: 56) in her mother. She  reports that she has never smoked. She has never used smokeless tobacco. She reports that she does not drink alcohol or use illicit drugs. She has a current medication list which includes the following prescription(s): desonide. Current Outpatient Prescriptions on File Prior to Visit  Medication Sig Dispense Refill  . desonide (DESOWEN) 0.05 % lotion        No current facility-administered medications on file prior to visit.   She has No Known Allergies..  Review of Systems Review of Systems  Constitutional: Negative for activity change, appetite change and fatigue.   HENT: Negative for hearing loss, congestion, tinnitus and ear discharge.  dentist q87m Eyes: Negative for visual disturbance (see optho q1y -- vision corrected to 20/20 with glasses).  Respiratory: Negative for cough, chest tightness and shortness of breath.   Cardiovascular: Negative for chest pain, palpitations and leg swelling.  Gastrointestinal: Negative for abdominal pain, diarrhea, constipation and abdominal distention.  Genitourinary: Negative for urgency, frequency, decreased urine volume and difficulty urinating.  Musculoskeletal: Negative for back pain, arthralgias and gait problem.  Skin: Negative for color change, pallor and rash.  Neurological: Negative for dizziness, light-headedness, numbness and headaches.  Hematological: Negative for adenopathy. Does not bruise/bleed easily.  Psychiatric/Behavioral: Negative for suicidal ideas, confusion, sleep disturbance, self-injury, dysphoric mood, decreased concentration and agitation.       Objective:    BP 110/64  Pulse 73  Temp(Src) 98.1 F (36.7 C) (Oral)  Ht 5\' 1"  (1.549 m)  Wt 175 lb (79.379 kg)  BMI 33.08 kg/m2  SpO2 95% General appearance: alert, cooperative, appears stated age and no distress Head: Normocephalic, without obvious abnormality, atraumatic Eyes: conjunctivae/corneas clear. PERRL, EOM's intact. Fundi benign. Ears: normal TM's and external ear canals both ears Nose: Nares normal. Septum midline. Mucosa normal. No drainage or sinus tenderness. Throat: lips, mucosa, and tongue normal; teeth and gums normal Neck: no adenopathy, no carotid bruit, no JVD, supple, symmetrical, trachea midline and thyroid not enlarged, symmetric,  no tenderness/mass/nodules Back: symmetric, no curvature. ROM normal. No CVA tenderness. Lungs: clear to auscultation bilaterally Breasts: normal appearance, no masses or tenderness Heart: regular rate and rhythm, S1, S2 normal, no murmur, click, rub or gallop Abdomen: soft,  non-tender; bowel sounds normal; no masses,  no organomegaly Pelvic: not indicated; post-menopausal, no abnormal Pap smears in past Extremities: extremities normal, atraumatic, no cyanosis or edema Pulses: 2+ and symmetric Skin: Skin color, texture, turgor normal. No rashes or lesions Lymph nodes: Cervical, supraclavicular, and axillary nodes normal. Neurologic: Alert and oriented X 3, normal strength and tone. Normal symmetric reflexes. Normal coordination and gait Psych-- no depression, no anxiety       EKG--NSR Assessment:    Healthy female exam.      Plan:    ghm utd Check labs See After Visit Summary for Counseling Recommendations

## 2013-06-10 NOTE — Patient Instructions (Signed)
Preventive Care for Adults, Female A healthy lifestyle and preventive care can promote health and wellness. Preventive health guidelines for women include the following key practices.  A routine yearly physical is a good way to check with your caregiver about your health and preventive screening. It is a chance to share any concerns and updates on your health, and to receive a thorough exam.  Visit your dentist for a routine exam and preventive care every 6 months. Brush your teeth twice a day and floss once a day. Good oral hygiene prevents tooth decay and gum disease.  The frequency of eye exams is based on your age, health, family medical history, use of contact lenses, and other factors. Follow your caregiver's recommendations for frequency of eye exams.  Eat a healthy diet. Foods like vegetables, fruits, whole grains, low-fat dairy products, and lean protein foods contain the nutrients you need without too many calories. Decrease your intake of foods high in solid fats, added sugars, and salt. Eat the right amount of calories for you.Get information about a proper diet from your caregiver, if necessary.  Regular physical exercise is one of the most important things you can do for your health. Most adults should get at least 150 minutes of moderate-intensity exercise (any activity that increases your heart rate and causes you to sweat) each week. In addition, most adults need muscle-strengthening exercises on 2 or more days a week.  Maintain a healthy weight. The body mass index (BMI) is a screening tool to identify possible weight problems. It provides an estimate of body fat based on height and weight. Your caregiver can help determine your BMI, and can help you achieve or maintain a healthy weight.For adults 20 years and older:  A BMI below 18.5 is considered underweight.  A BMI of 18.5 to 24.9 is normal.  A BMI of 25 to 29.9 is considered overweight.  A BMI of 30 and above is  considered obese.  Maintain normal blood lipids and cholesterol levels by exercising and minimizing your intake of saturated fat. Eat a balanced diet with plenty of fruit and vegetables. Blood tests for lipids and cholesterol should begin at age 20 and be repeated every 5 years. If your lipid or cholesterol levels are high, you are over 50, or you are at high risk for heart disease, you may need your cholesterol levels checked more frequently.Ongoing high lipid and cholesterol levels should be treated with medicines if diet and exercise are not effective.  If you smoke, find out from your caregiver how to quit. If you do not use tobacco, do not start.  Lung cancer screening is recommended for adults aged 55 80 years who are at high risk for developing lung cancer because of a history of smoking. Yearly low-dose computed tomography (CT) is recommended for people who have at least a 30-pack-year history of smoking and are a current smoker or have quit within the past 15 years. A pack year of smoking is smoking an average of 1 pack of cigarettes a day for 1 year (for example: 1 pack a day for 30 years or 2 packs a day for 15 years). Yearly screening should continue until the smoker has stopped smoking for at least 15 years. Yearly screening should also be stopped for people who develop a health problem that would prevent them from having lung cancer treatment.  If you are pregnant, do not drink alcohol. If you are breastfeeding, be very cautious about drinking alcohol. If you are   not pregnant and choose to drink alcohol, do not exceed 1 drink per day. One drink is considered to be 12 ounces (355 mL) of beer, 5 ounces (148 mL) of wine, or 1.5 ounces (44 mL) of liquor.  Avoid use of street drugs. Do not share needles with anyone. Ask for help if you need support or instructions about stopping the use of drugs.  High blood pressure causes heart disease and increases the risk of stroke. Your blood pressure  should be checked at least every 1 to 2 years. Ongoing high blood pressure should be treated with medicines if weight loss and exercise are not effective.  If you are 55 to 57 years old, ask your caregiver if you should take aspirin to prevent strokes.  Diabetes screening involves taking a blood sample to check your fasting blood sugar level. This should be done once every 3 years, after age 45, if you are within normal weight and without risk factors for diabetes. Testing should be considered at a younger age or be carried out more frequently if you are overweight and have at least 1 risk factor for diabetes.  Breast cancer screening is essential preventive care for women. You should practice "breast self-awareness." This means understanding the normal appearance and feel of your breasts and may include breast self-examination. Any changes detected, no matter how small, should be reported to a caregiver. Women in their 20s and 30s should have a clinical breast exam (CBE) by a caregiver as part of a regular health exam every 1 to 3 years. After age 40, women should have a CBE every year. Starting at age 40, women should consider having a mammography (breast X-ray test) every year. Women who have a family history of breast cancer should talk to their caregiver about genetic screening. Women at a high risk of breast cancer should talk to their caregivers about having magnetic resonance imaging (MRI) and a mammography every year.  Breast cancer gene (BRCA)-related cancer risk assessment is recommended for women who have family members with BRCA-related cancers. BRCA-related cancers include breast, ovarian, tubal, and peritoneal cancers. Having family members with these cancers may be associated with an increased risk for harmful changes (mutations) in the breast cancer genes BRCA1 and BRCA2. Results of the assessment will determine the need for genetic counseling and BRCA1 and BRCA2 testing.  The Pap test is  a screening test for cervical cancer. A Pap test can show cell changes on the cervix that might become cervical cancer if left untreated. A Pap test is a procedure in which cells are obtained and examined from the lower end of the uterus (cervix).  Women should have a Pap test starting at age 21.  Between ages 21 and 29, Pap tests should be repeated every 2 years.  Beginning at age 30, you should have a Pap test every 3 years as long as the past 3 Pap tests have been normal.  Some women have medical problems that increase the chance of getting cervical cancer. Talk to your caregiver about these problems. It is especially important to talk to your caregiver if a new problem develops soon after your last Pap test. In these cases, your caregiver may recommend more frequent screening and Pap tests.  The above recommendations are the same for women who have or have not gotten the vaccine for human papillomavirus (HPV).  If you had a hysterectomy for a problem that was not cancer or a condition that could lead to cancer, then   you no longer need Pap tests. Even if you no longer need a Pap test, a regular exam is a good idea to make sure no other problems are starting.  If you are between ages 65 and 70, and you have had normal Pap tests going back 10 years, you no longer need Pap tests. Even if you no longer need a Pap test, a regular exam is a good idea to make sure no other problems are starting.  If you have had past treatment for cervical cancer or a condition that could lead to cancer, you need Pap tests and screening for cancer for at least 20 years after your treatment.  If Pap tests have been discontinued, risk factors (such as a new sexual partner) need to be reassessed to determine if screening should be resumed.  The HPV test is an additional test that may be used for cervical cancer screening. The HPV test looks for the virus that can cause the cell changes on the cervix. The cells collected  during the Pap test can be tested for HPV. The HPV test could be used to screen women aged 30 years and older, and should be used in women of any age who have unclear Pap test results. After the age of 30, women should have HPV testing at the same frequency as a Pap test.  Colorectal cancer can be detected and often prevented. Most routine colorectal cancer screening begins at the age of 50 and continues through age 75. However, your caregiver may recommend screening at an earlier age if you have risk factors for colon cancer. On a yearly basis, your caregiver may provide home test kits to check for hidden blood in the stool. Use of a small camera at the end of a tube, to directly examine the colon (sigmoidoscopy or colonoscopy), can detect the earliest forms of colorectal cancer. Talk to your caregiver about this at age 50, when routine screening begins. Direct examination of the colon should be repeated every 5 to 10 years through age 75, unless early forms of pre-cancerous polyps or small growths are found.  Hepatitis C blood testing is recommended for all people born from 1945 through 1965 and any individual with known risks for hepatitis C.  Practice safe sex. Use condoms and avoid high-risk sexual practices to reduce the spread of sexually transmitted infections (STIs). STIs include gonorrhea, chlamydia, syphilis, trichomonas, herpes, HPV, and human immunodeficiency virus (HIV). Herpes, HIV, and HPV are viral illnesses that have no cure. They can result in disability, cancer, and death. Sexually active women aged 25 and younger should be checked for chlamydia. Older women with new or multiple partners should also be tested for chlamydia. Testing for other STIs is recommended if you are sexually active and at increased risk.  Osteoporosis is a disease in which the bones lose minerals and strength with aging. This can result in serious bone fractures. The risk of osteoporosis can be identified using a  bone density scan. Women ages 65 and over and women at risk for fractures or osteoporosis should discuss screening with their caregivers. Ask your caregiver whether you should take a calcium supplement or vitamin D to reduce the rate of osteoporosis.  Menopause can be associated with physical symptoms and risks. Hormone replacement therapy is available to decrease symptoms and risks. You should talk to your caregiver about whether hormone replacement therapy is right for you.  Use sunscreen. Apply sunscreen liberally and repeatedly throughout the day. You should seek shade   when your shadow is shorter than you. Protect yourself by wearing long sleeves, pants, a wide-brimmed hat, and sunglasses year round, whenever you are outdoors.  Once a month, do a whole body skin exam, using a mirror to look at the skin on your back. Notify your caregiver of new moles, moles that have irregular borders, moles that are larger than a pencil eraser, or moles that have changed in shape or color.  Stay current with required immunizations.  Influenza vaccine. All adults should be immunized every year.  Tetanus, diphtheria, and acellular pertussis (Td, Tdap) vaccine. Pregnant women should receive 1 dose of Tdap vaccine during each pregnancy. The dose should be obtained regardless of the length of time since the last dose. Immunization is preferred during the 27th to 36th week of gestation. An adult who has not previously received Tdap or who does not know her vaccine status should receive 1 dose of Tdap. This initial dose should be followed by tetanus and diphtheria toxoids (Td) booster doses every 10 years. Adults with an unknown or incomplete history of completing a 3-dose immunization series with Td-containing vaccines should begin or complete a primary immunization series including a Tdap dose. Adults should receive a Td booster every 10 years.  Varicella vaccine. An adult without evidence of immunity to varicella  should receive 2 doses or a second dose if she has previously received 1 dose. Pregnant females who do not have evidence of immunity should receive the first dose after pregnancy. This first dose should be obtained before leaving the health care facility. The second dose should be obtained 4 8 weeks after the first dose.  Human papillomavirus (HPV) vaccine. Females aged 13 26 years who have not received the vaccine previously should obtain the 3-dose series. The vaccine is not recommended for use in pregnant females. However, pregnancy testing is not needed before receiving a dose. If a female is found to be pregnant after receiving a dose, no treatment is needed. In that case, the remaining doses should be delayed until after the pregnancy. Immunization is recommended for any person with an immunocompromised condition through the age of 26 years if she did not get any or all doses earlier. During the 3-dose series, the second dose should be obtained 4 8 weeks after the first dose. The third dose should be obtained 24 weeks after the first dose and 16 weeks after the second dose.  Zoster vaccine. One dose is recommended for adults aged 60 years or older unless certain conditions are present.  Measles, mumps, and rubella (MMR) vaccine. Adults born before 1957 generally are considered immune to measles and mumps. Adults born in 1957 or later should have 1 or more doses of MMR vaccine unless there is a contraindication to the vaccine or there is laboratory evidence of immunity to each of the three diseases. A routine second dose of MMR vaccine should be obtained at least 28 days after the first dose for students attending postsecondary schools, health care workers, or international travelers. People who received inactivated measles vaccine or an unknown type of measles vaccine during 1963 1967 should receive 2 doses of MMR vaccine. People who received inactivated mumps vaccine or an unknown type of mumps vaccine  before 1979 and are at high risk for mumps infection should consider immunization with 2 doses of MMR vaccine. For females of childbearing age, rubella immunity should be determined. If there is no evidence of immunity, females who are not pregnant should be vaccinated. If there   is no evidence of immunity, females who are pregnant should delay immunization until after pregnancy. Unvaccinated health care workers born before 1957 who lack laboratory evidence of measles, mumps, or rubella immunity or laboratory confirmation of disease should consider measles and mumps immunization with 2 doses of MMR vaccine or rubella immunization with 1 dose of MMR vaccine.  Pneumococcal 13-valent conjugate (PCV13) vaccine. When indicated, a person who is uncertain of her immunization history and has no record of immunization should receive the PCV13 vaccine. An adult aged 19 years or older who has certain medical conditions and has not been previously immunized should receive 1 dose of PCV13 vaccine. This PCV13 should be followed with a dose of pneumococcal polysaccharide (PPSV23) vaccine. The PPSV23 vaccine dose should be obtained at least 8 weeks after the dose of PCV13 vaccine. An adult aged 19 years or older who has certain medical conditions and previously received 1 or more doses of PPSV23 vaccine should receive 1 dose of PCV13. The PCV13 vaccine dose should be obtained 1 or more years after the last PPSV23 vaccine dose.  Pneumococcal polysaccharide (PPSV23) vaccine. When PCV13 is also indicated, PCV13 should be obtained first. All adults aged 65 years and older should be immunized. An adult younger than age 65 years who has certain medical conditions should be immunized. Any person who resides in a nursing home or long-term care facility should be immunized. An adult smoker should be immunized. People with an immunocompromised condition and certain other conditions should receive both PCV13 and PPSV23 vaccines. People  with human immunodeficiency virus (HIV) infection should be immunized as soon as possible after diagnosis. Immunization during chemotherapy or radiation therapy should be avoided. Routine use of PPSV23 vaccine is not recommended for American Indians, [place] Natives, or people younger than 65 years unless there are medical conditions that require PPSV23 vaccine. When indicated, people who have unknown immunization and have no record of immunization should receive PPSV23 vaccine. One-time revaccination 5 years after the first dose of PPSV23 is recommended for people aged 19 64 years who have chronic kidney failure, nephrotic syndrome, asplenia, or immunocompromised conditions. People who received 1 2 doses of PPSV23 before age 65 years should receive another dose of PPSV23 vaccine at age 65 years or later if at least 5 years have passed since the previous dose. Doses of PPSV23 are not needed for people immunized with PPSV23 at or after age 65 years.  Meningococcal vaccine. Adults with asplenia or persistent complement component deficiencies should receive 2 doses of quadrivalent meningococcal conjugate (MenACWY-D) vaccine. The doses should be obtained at least 2 months apart. Microbiologists working with certain meningococcal bacteria, military recruits, people at risk during an outbreak, and people who travel to or live in countries with a high rate of meningitis should be immunized. A first-year college student up through age 21 years who is living in a residence hall should receive a dose if she did not receive a dose on or after her 16th birthday. Adults who have certain high-risk conditions should receive one or more doses of vaccine.  Hepatitis A vaccine. Adults who wish to be protected from this disease, have certain high-risk conditions, work with hepatitis A-infected animals, work in hepatitis A research labs, or travel to or work in countries with a high rate of hepatitis A should be immunized. Adults  who were previously unvaccinated and who anticipate close contact with an international adoptee during the first 60 days after arrival in the [place] from a country   with a high rate of hepatitis A should be immunized.  Hepatitis B vaccine. Adults who wish to be protected from this disease, have certain high-risk conditions, may be exposed to blood or other infectious body fluids, are household contacts or sex partners of hepatitis B positive people, are clients or workers in certain care facilities, or travel to or work in countries with a high rate of hepatitis B should be immunized.  Haemophilus influenzae type b (Hib) vaccine. A previously unvaccinated person with asplenia or sickle cell disease or having a scheduled splenectomy should receive 1 dose of Hib vaccine. Regardless of previous immunization, a recipient of a hematopoietic stem cell transplant should receive a 3-dose series 6 12 months after her successful transplant. Hib vaccine is not recommended for adults with HIV infection. Preventive Services / Frequency Ages 19 to 39  Blood pressure check.** / Every 1 to 2 years.  Lipid and cholesterol check.** / Every 5 years beginning at age 20.  Clinical breast exam.** / Every 3 years for women in their 20s and 30s.  BRCA-related cancer risk assessment.** / For women who have family members with a BRCA-related cancer (breast, ovarian, tubal, or peritoneal cancers).  Pap test.** / Every 2 years from ages 21 through 29. Every 3 years starting at age 30 through age 65 or 70 with a history of 3 consecutive normal Pap tests.  HPV screening.** / Every 3 years from ages 30 through ages 65 to 70 with a history of 3 consecutive normal Pap tests.  Hepatitis C blood test.** / For any individual with known risks for hepatitis C.  Skin self-exam. / Monthly.  Influenza vaccine. / Every year.  Tetanus, diphtheria, and acellular pertussis (Tdap, Td) vaccine.** / Consult your caregiver. Pregnant  women should receive 1 dose of Tdap vaccine during each pregnancy. 1 dose of Td every 10 years.  Varicella vaccine.** / Consult your caregiver. Pregnant females who do not have evidence of immunity should receive the first dose after pregnancy.  HPV vaccine. / 3 doses over 6 months, if 26 and younger. The vaccine is not recommended for use in pregnant females. However, pregnancy testing is not needed before receiving a dose.  Measles, mumps, rubella (MMR) vaccine.** / You need at least 1 dose of MMR if you were born in 1957 or later. You may also need a 2nd dose. For females of childbearing age, rubella immunity should be determined. If there is no evidence of immunity, females who are not pregnant should be vaccinated. If there is no evidence of immunity, females who are pregnant should delay immunization until after pregnancy.  Pneumococcal 13-valent conjugate (PCV13) vaccine.** / Consult your caregiver.  Pneumococcal polysaccharide (PPSV23) vaccine.** / 1 to 2 doses if you smoke cigarettes or if you have certain conditions.  Meningococcal vaccine.** / 1 dose if you are age 19 to 21 years and a first-year college student living in a residence hall, or have one of several medical conditions, you need to get vaccinated against meningococcal disease. You may also need additional booster doses.  Hepatitis A vaccine.** / Consult your caregiver.  Hepatitis B vaccine.** / Consult your caregiver.  Haemophilus influenzae type b (Hib) vaccine.** / Consult your caregiver. Ages 40 to 64  Blood pressure check.** / Every 1 to 2 years.  Lipid and cholesterol check.** / Every 5 years beginning at age 20.  Lung cancer screening. / Every year if you are aged 55 80 years and have a 30-pack-year history of smoking and   currently smoke or have quit within the past 15 years. Yearly screening is stopped once you have quit smoking for at least 15 years or develop a health problem that would prevent you from having  lung cancer treatment.  Clinical breast exam.** / Every year after age 40.  BRCA-related cancer risk assessment.** / For women who have family members with a BRCA-related cancer (breast, ovarian, tubal, or peritoneal cancers).  Mammogram.** / Every year beginning at age 40 and continuing for as long as you are in good health. Consult with your caregiver.  Pap test.** / Every 3 years starting at age 30 through age 65 or 70 with a history of 3 consecutive normal Pap tests.  HPV screening.** / Every 3 years from ages 30 through ages 65 to 70 with a history of 3 consecutive normal Pap tests.  Fecal occult blood test (FOBT) of stool. / Every year beginning at age 50 and continuing until age 75. You may not need to do this test if you get a colonoscopy every 10 years.  Flexible sigmoidoscopy or colonoscopy.** / Every 5 years for a flexible sigmoidoscopy or every 10 years for a colonoscopy beginning at age 50 and continuing until age 75.  Hepatitis C blood test.** / For all people born from 1945 through 1965 and any individual with known risks for hepatitis C.  Skin self-exam. / Monthly.  Influenza vaccine. / Every year.  Tetanus, diphtheria, and acellular pertussis (Tdap/Td) vaccine.** / Consult your caregiver. Pregnant women should receive 1 dose of Tdap vaccine during each pregnancy. 1 dose of Td every 10 years.  Varicella vaccine.** / Consult your caregiver. Pregnant females who do not have evidence of immunity should receive the first dose after pregnancy.  Zoster vaccine.** / 1 dose for adults aged 60 years or older.  Measles, mumps, rubella (MMR) vaccine.** / You need at least 1 dose of MMR if you were born in 1957 or later. You may also need a 2nd dose. For females of childbearing age, rubella immunity should be determined. If there is no evidence of immunity, females who are not pregnant should be vaccinated. If there is no evidence of immunity, females who are pregnant should delay  immunization until after pregnancy.  Pneumococcal 13-valent conjugate (PCV13) vaccine.** / Consult your caregiver.  Pneumococcal polysaccharide (PPSV23) vaccine.** / 1 to 2 doses if you smoke cigarettes or if you have certain conditions.  Meningococcal vaccine.** / Consult your caregiver.  Hepatitis A vaccine.** / Consult your caregiver.  Hepatitis B vaccine.** / Consult your caregiver.  Haemophilus influenzae type b (Hib) vaccine.** / Consult your caregiver. Ages 65 and over  Blood pressure check.** / Every 1 to 2 years.  Lipid and cholesterol check.** / Every 5 years beginning at age 20.  Lung cancer screening. / Every year if you are aged 55 80 years and have a 30-pack-year history of smoking and currently smoke or have quit within the past 15 years. Yearly screening is stopped once you have quit smoking for at least 15 years or develop a health problem that would prevent you from having lung cancer treatment.  Clinical breast exam.** / Every year after age 40.  BRCA-related cancer risk assessment.** / For women who have family members with a BRCA-related cancer (breast, ovarian, tubal, or peritoneal cancers).  Mammogram.** / Every year beginning at age 40 and continuing for as long as you are in good health. Consult with your caregiver.  Pap test.** / Every 3 years starting at age   30 through age 65 or 70 with a 3 consecutive normal Pap tests. Testing can be stopped between 65 and 70 with 3 consecutive normal Pap tests and no abnormal Pap or HPV tests in the past 10 years.  HPV screening.** / Every 3 years from ages 30 through ages 65 or 70 with a history of 3 consecutive normal Pap tests. Testing can be stopped between 65 and 70 with 3 consecutive normal Pap tests and no abnormal Pap or HPV tests in the past 10 years.  Fecal occult blood test (FOBT) of stool. / Every year beginning at age 50 and continuing until age 75. You may not need to do this test if you get a colonoscopy  every 10 years.  Flexible sigmoidoscopy or colonoscopy.** / Every 5 years for a flexible sigmoidoscopy or every 10 years for a colonoscopy beginning at age 50 and continuing until age 75.  Hepatitis C blood test.** / For all people born from 1945 through 1965 and any individual with known risks for hepatitis C.  Osteoporosis screening.** / A one-time screening for women ages 65 and over and women at risk for fractures or osteoporosis.  Skin self-exam. / Monthly.  Influenza vaccine. / Every year.  Tetanus, diphtheria, and acellular pertussis (Tdap/Td) vaccine.** / 1 dose of Td every 10 years.  Varicella vaccine.** / Consult your caregiver.  Zoster vaccine.** / 1 dose for adults aged 60 years or older.  Pneumococcal 13-valent conjugate (PCV13) vaccine.** / Consult your caregiver.  Pneumococcal polysaccharide (PPSV23) vaccine.** / 1 dose for all adults aged 65 years and older.  Meningococcal vaccine.** / Consult your caregiver.  Hepatitis A vaccine.** / Consult your caregiver.  Hepatitis B vaccine.** / Consult your caregiver.  Haemophilus influenzae type b (Hib) vaccine.** / Consult your caregiver. ** Family history and personal history of risk and conditions may change your caregiver's recommendations. Document Released: 07/26/2001 Document Revised: 09/24/2012 Document Reviewed: 10/25/2010 [employer] Patient Information 2014 [employer].  

## 2013-06-10 NOTE — Assessment & Plan Note (Signed)
ekg normal Check labs  

## 2013-09-10 ENCOUNTER — Ambulatory Visit (INDEPENDENT_AMBULATORY_CARE_PROVIDER_SITE_OTHER): Payer: 59 | Admitting: Family Medicine

## 2013-09-10 ENCOUNTER — Encounter: Payer: Self-pay | Admitting: Family Medicine

## 2013-09-10 VITALS — BP 122/80 | HR 76 | Temp 97.8°F | Ht 61.0 in | Wt 176.0 lb

## 2013-09-10 DIAGNOSIS — L259 Unspecified contact dermatitis, unspecified cause: Secondary | ICD-10-CM

## 2013-09-10 DIAGNOSIS — E785 Hyperlipidemia, unspecified: Secondary | ICD-10-CM

## 2013-09-10 DIAGNOSIS — L309 Dermatitis, unspecified: Secondary | ICD-10-CM

## 2013-09-10 MED ORDER — DESONIDE 0.05 % EX LOTN: TOPICAL_LOTION | Freq: Two times a day (BID) | CUTANEOUS | Status: DC

## 2013-09-10 NOTE — Progress Notes (Signed)
Pre visit review using our clinic review tool, if applicable. No additional management support is needed unless otherwise documented below in the visit note. 

## 2013-09-10 NOTE — Progress Notes (Signed)
  Subjective:    Elizabeth [name] is a 58 y.o. female who presents for evaluation of headache. Symptoms began about 2 weeks ago. Generally, the headaches last about several hours and occur several times per week. The headaches do not seem to be related to any time of the day. The headaches are usually dull and are located in frontal and temporal.  The patient rates her most severe headaches. Recently, the headaches have been increasing in severity. Work attendance or other daily activities are not affected by the headaches. Precipitating factors include: weather changes. The headaches are usually not preceded by an aura. Associated neurologic symptoms: none. The patient denies decreased physical activity, depression, dizziness, loss of balance, muscle weakness, numbness of extremities, speech difficulties, vision problems, vomiting in the early morning and worsening school/work performance. Home treatment has included aleve and resting with little improvement. Other history includes: allergic rhinitis. Family history includes no known family members with significant headaches.  The following portions of the patient's history were reviewed and updated as appropriate: allergies, current medications, past family history, past medical history, past social history, past surgical history and problem list.  Review of Systems Pertinent items are noted in HPI.    Objective:    BP 122/80  Pulse 76  Temp(Src) 97.8 F (36.6 C) (Oral)  Ht 5\' 1"  (1.549 m)  Wt 176 lb (79.833 kg)  BMI 33.27 kg/m2  SpO2 98% General appearance: alert, cooperative, appears stated age and no distress Ears: + cerumen ,  TMI  Nose: Nares normal. Septum midline. Mucosa normal. No drainage or sinus tenderness., clear discharge, moderate congestion, turbinates red, swollen Throat: abnormal findings: mild oropharyngeal erythema and pnd  Neck: moderate anterior cervical adenopathy, supple, symmetrical, trachea midline and thyroid not  enlarged, symmetric, no tenderness/mass/nodules Lungs: clear to auscultation bilaterally Heart: S1, S2 normal    Assessment:    Sinusitis, acute -- allergic    Plan:    Lie in darkened room and apply cold packs as needed for pain. Patient reassured that neurodiagnostic workup not indicated from benign H&P. flonase otc and otc antihistamine  rto prn

## 2013-09-10 NOTE — Patient Instructions (Signed)

## 2013-09-12 ENCOUNTER — Other Ambulatory Visit (INDEPENDENT_AMBULATORY_CARE_PROVIDER_SITE_OTHER): Payer: 59

## 2013-09-12 DIAGNOSIS — E785 Hyperlipidemia, unspecified: Secondary | ICD-10-CM

## 2013-09-12 LAB — HEPATIC FUNCTION PANEL
ALT: 28 U/L (ref 0–35)
AST: 26 U/L (ref 0–37)
Albumin: 4.4 g/dL (ref 3.5–5.2)
Alkaline Phosphatase: 62 U/L (ref 39–117)
BILIRUBIN TOTAL: 1.5 mg/dL — AB (ref 0.3–1.2)
Bilirubin, Direct: 0 mg/dL (ref 0.0–0.3)
Total Protein: 8 g/dL (ref 6.0–8.3)

## 2013-09-12 LAB — LIPID PANEL
CHOLESTEROL: 213 mg/dL — AB (ref 0–200)
HDL: 50.3 mg/dL (ref >39.00)
LDL CALC: 141 mg/dL — AB (ref 0–99)
TRIGLYCERIDES: 111 mg/dL (ref 0.0–149.0)
Total CHOL/HDL Ratio: 4
VLDL: 22.2 mg/dL (ref 0.0–40.0)

## 2013-10-17 ENCOUNTER — Ambulatory Visit: Payer: 59 | Admitting: Family Medicine

## 2013-10-18 ENCOUNTER — Ambulatory Visit: Payer: 59 | Admitting: Internal Medicine

## 2013-11-25 ENCOUNTER — Ambulatory Visit (INDEPENDENT_AMBULATORY_CARE_PROVIDER_SITE_OTHER): Payer: 59 | Admitting: Internal Medicine

## 2013-11-25 ENCOUNTER — Encounter: Payer: Self-pay | Admitting: Internal Medicine

## 2013-11-25 VITALS — BP 113/75 | HR 70 | Temp 98.3°F | Wt 176.0 lb

## 2013-11-25 DIAGNOSIS — J029 Acute pharyngitis, unspecified: Secondary | ICD-10-CM

## 2013-11-25 DIAGNOSIS — B349 Viral infection, unspecified: Secondary | ICD-10-CM

## 2013-11-25 DIAGNOSIS — B9789 Other viral agents as the cause of diseases classified elsewhere: Secondary | ICD-10-CM

## 2013-11-25 LAB — POCT RAPID STREP A (OFFICE): Rapid Strep A Screen: NEGATIVE

## 2013-11-25 MED ORDER — HYDROCODONE-HOMATROPINE 5-1.5 MG/5ML PO SYRP: 5.0000 mL | ORAL_SOLUTION | Freq: Every evening | ORAL | Status: DC | PRN

## 2013-11-25 NOTE — Patient Instructions (Addendum)
Rest, fluids , tylenol For cough, take Mucinex DM twice a day as needed  if cough severe, take hydrocodone at bedtime, will cause drowsiness For congestion use OTC Nasocort: 2 nasal sprays on each side of the nose daily until you feel better   Call if no better in few days Call anytime if the symptoms are severe

## 2013-11-25 NOTE — Progress Notes (Signed)
   Subjective:    Patient ID: Elizabeth [name], female    DOB: 25-Jun-1955, 58 y.o.   MRN: [id number]  DOS:  11/25/2013 Type of  Visit: acute  History:  Symptoms started 5 days ago with on and off sore throat, headaches and aches and pains. The symptoms are ongoing this week but not severe. Not taking any particular medication for her condition   ROS Temperature was as high as 98.8, some chills. Denies any pain in the sinus area, and no nasal discharge. No nausea, vomiting, diarrhea. + Cough, has a hard time sleeping, some sputum production, color?. Mild headache. No generalized rash  Past Medical History  Diagnosis Date  . Acne   . Eczema     Past Surgical History  Procedure Laterality Date  . Tubal ligation      History   Social History  . Marital Status: Single    Spouse Name: N/A    Number of Children: 1  . Years of Education: 14   Occupational History  . 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 Smoking status: Never Smoker   . Smokeless tobacco: Never Used  . Alcohol Use: No  . Drug Use: No  . Sexual Activity: No   Other Topics Concern  . Not on file   Social History Narrative   Exercise--- no        Medication List       This list is accurate as of: 11/25/13 11:59 PM.  Always use your most recent med list.               desonide 0.05 % lotion  Commonly known as:  DESOWEN  Apply topically 2 (two) times daily.     HYDROcodone-homatropine 5-1.5 MG/5ML syrup  Commonly known as:  HYCODAN  Take 5 mLs by mouth at bedtime as needed for cough.           Objective:   Physical Exam BP 113/75  Pulse 70  Temp(Src) 98.3 F (36.8 C)  Wt 176 lb (79.833 kg)  SpO2 98% General -- alert, well-developed, NAD.   HEENT-- Not pale. TMs obscured by wax, throat symmetric, slt redness, no discharge. Face symmetric, sinuses not tender to palpation. Nose  congested. Lungs -- normal respiratory effort, no intercostal retractions, no  accessory muscle use, and normal breath sounds.  Heart-- normal rate, regular rhythm, no murmur.  Extremities-- no pretibial edema bilaterally  Neurologic--  alert & oriented X3. Speech normal, gait appropriate for age, strength symmetric and appropriate for age.   Psych-- Cognition and judgment appear intact. Cooperative with normal attention span and concentration. No anxious or depressed appearing.        Assessment & Plan:   Viral syndrome, Strep A (-) Symptoms consistent with a viral syndrome, recommend conservative treatment. See instructions.

## 2013-11-25 NOTE — Progress Notes (Signed)
Pre visit review using our clinic review tool, if applicable. No additional management support is needed unless otherwise documented below in the visit note. 

## 2014-05-01 ENCOUNTER — Telehealth: Payer: Self-pay | Admitting: Family Medicine

## 2014-05-01 NOTE — Telephone Encounter (Signed)
Fine with me

## 2014-05-01 NOTE — Telephone Encounter (Signed)
Fine

## 2014-05-01 NOTE — Telephone Encounter (Signed)
Patient is requesting to switch from [facility] to [facility] b/c she can not drive to [place].  Please advise.

## 2014-05-16 ENCOUNTER — Encounter: Payer: 59 | Admitting: Family Medicine

## 2014-06-19 ENCOUNTER — Encounter: Payer: Self-pay | Admitting: Family Medicine

## 2014-06-19 ENCOUNTER — Ambulatory Visit (INDEPENDENT_AMBULATORY_CARE_PROVIDER_SITE_OTHER): Payer: 59 | Admitting: Family Medicine

## 2014-06-19 VITALS — BP 116/74 | HR 67 | Temp 97.7°F | Resp 16 | Ht 60.5 in | Wt 174.0 lb

## 2014-06-19 DIAGNOSIS — Z0000 Encounter for general adult medical examination without abnormal findings: Secondary | ICD-10-CM

## 2014-06-19 LAB — POCT URINALYSIS DIPSTICK
BILIRUBIN UA: NEGATIVE
Blood, UA: NEGATIVE
Glucose, UA: NEGATIVE
KETONES UA: NEGATIVE
Leukocytes, UA: NEGATIVE
Nitrite, UA: NEGATIVE
PH UA: 6
PROTEIN UA: NEGATIVE
Urobilinogen, UA: NEGATIVE

## 2014-06-19 LAB — BASIC METABOLIC PANEL
BUN: 24 mg/dL — AB (ref 6–23)
CALCIUM: 9.9 mg/dL (ref 8.4–10.5)
CO2: 27 mEq/L (ref 19–32)
Chloride: 105 mEq/L (ref 96–112)
Creatinine, Ser: 0.9 mg/dL (ref 0.4–1.2)
GFR: 79.45 mL/min (ref >60.00)
Glucose, Bld: 95 mg/dL (ref 70–99)
POTASSIUM: 3.7 meq/L (ref 3.5–5.1)
Sodium: 139 mEq/L (ref 135–145)

## 2014-06-19 LAB — LIPID PANEL
CHOL/HDL RATIO: 4
Cholesterol: 221 mg/dL — ABNORMAL HIGH (ref 0–200)
HDL: 49.2 mg/dL (ref >39.00)
LDL CALC: 148 mg/dL — AB (ref 0–99)
NONHDL: 171.8
TRIGLYCERIDES: 120 mg/dL (ref 0.0–149.0)
VLDL: 24 mg/dL (ref 0.0–40.0)

## 2014-06-19 LAB — CBC WITH DIFFERENTIAL/PLATELET
Basophils Absolute: 0 10*3/uL (ref 0.0–0.1)
Basophils Relative: 0.7 % (ref 0.0–3.0)
Eosinophils Absolute: 0.1 10*3/uL (ref 0.0–0.7)
Eosinophils Relative: 2.2 % (ref 0.0–5.0)
HCT: 43.3 % (ref 36.0–46.0)
Hemoglobin: 14.4 g/dL (ref 12.0–15.0)
LYMPHS ABS: 2.6 10*3/uL (ref 0.7–4.0)
LYMPHS PCT: 38.3 % (ref 12.0–46.0)
MCHC: 33.2 g/dL (ref 30.0–36.0)
MCV: 91.1 fl (ref 78.0–100.0)
Monocytes Absolute: 0.6 10*3/uL (ref 0.1–1.0)
Monocytes Relative: 8.3 % (ref 3.0–12.0)
Neutro Abs: 3.4 10*3/uL (ref 1.4–7.7)
Neutrophils Relative %: 50.5 % (ref 43.0–77.0)
Platelets: 223 10*3/uL (ref 150.0–400.0)
RBC: 4.76 Mil/uL (ref 3.87–5.11)
RDW: 12.5 % (ref 11.5–15.5)
WBC: 6.7 10*3/uL (ref 4.0–10.5)

## 2014-06-19 LAB — HEPATIC FUNCTION PANEL
ALBUMIN: 4.5 g/dL (ref 3.5–5.2)
ALT: 30 U/L (ref 0–35)
AST: 31 U/L (ref 0–37)
Alkaline Phosphatase: 69 U/L (ref 39–117)
BILIRUBIN DIRECT: 0.1 mg/dL (ref 0.0–0.3)
Total Bilirubin: 1.4 mg/dL — ABNORMAL HIGH (ref 0.2–1.2)
Total Protein: 8.5 g/dL — ABNORMAL HIGH (ref 6.0–8.3)

## 2014-06-19 LAB — TSH: TSH: 1.02 u[IU]/mL (ref 0.35–4.50)

## 2014-06-19 NOTE — Progress Notes (Signed)
Subjective:     Elizabeth [name] is a 59 y.o. female and is here for a comprehensive physical exam. The patient reports no problems.  History   Social History  . Marital Status: Single    Spouse Name: N/A    Number of Children: 1  . 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 Smoking status: Never Smoker   . Smokeless tobacco: Never Used  . Alcohol Use: No  . Drug Use: No  . Sexual Activity: No   Other Topics Concern  . Not on file   Social History Narrative   Exercise--- no   Health Maintenance  Topic Date Due  . INFLUENZA VACCINE  02/27/2015 (Originally 01/11/2014)  . MAMMOGRAM  03/26/2015  . TETANUS/TDAP  05/26/2015  . PAP SMEAR  06/04/2016  . COLONOSCOPY  05/21/2023    The following portions of the patient's history were reviewed and updated as appropriate:  She  has a past medical history of Acne and Eczema. She  does not have any pertinent problems on file. She  has past surgical history that includes Tubal ligation. Her family history includes Aneurysm in her maternal grandfather; Breast cancer in her maternal aunt and sister; Cancer (age of onset: 43) in her sister; Heart disease (age of onset: 43) in her father; Hepatitis (age of onset: 86) in her mother. She  reports that she has never smoked. She has never used smokeless tobacco. She reports that she does not drink alcohol or use illicit drugs. She has a current medication list which includes the following prescription(s): desonide. Current Outpatient Prescriptions on File Prior to Visit  Medication Sig Dispense Refill  . desonide (DESOWEN) 0.05 % lotion Apply topically 2 (two) times daily. 59 mL 3   No current facility-administered medications on file prior to visit.   She has No Known Allergies..  Review of Systems Review of Systems  Constitutional: Negative for activity change, appetite change and fatigue.  HENT: Negative for hearing  loss, congestion, tinnitus and ear discharge.  dentist q60m Eyes: Negative for visual disturbance (see optho q1y -- vision corrected to 20/20 with glasses).  Respiratory: Negative for cough, chest tightness and shortness of breath.   Cardiovascular: Negative for chest pain, palpitations and leg swelling.  Gastrointestinal: Negative for abdominal pain, diarrhea, constipation and abdominal distention.  Genitourinary: Negative for urgency, frequency, decreased urine volume and difficulty urinating.  Musculoskeletal: Negative for back pain, arthralgias and gait problem.  Skin: Negative for color change, pallor and rash.  Neurological: Negative for dizziness, light-headedness, numbness and headaches.  Hematological: Negative for adenopathy. Does not bruise/bleed easily.  Psychiatric/Behavioral: Negative for suicidal ideas, confusion, sleep disturbance, self-injury, dysphoric mood, decreased concentration and agitation.      Objective:    BP 116/74 mmHg  Pulse 67  Temp(Src) 97.7 F (36.5 C) (Oral)  Resp 16  Ht 5' 0.5" (1.537 m)  Wt 174 lb (78.926 kg)  BMI 33.41 kg/m2  SpO2 95% General appearance: alert, cooperative, appears stated age and no distress Head: Normocephalic, without obvious abnormality, atraumatic Eyes: conjunctivae/corneas clear. PERRL, EOM's intact. Fundi benign. Ears: normal TM's and external ear canals both ears Nose: Nares normal. Septum midline. Mucosa normal. No drainage or sinus tenderness. Throat: lips, mucosa, and tongue normal; teeth and gums normal Neck: no adenopathy, no carotid bruit, no JVD, supple, symmetrical, trachea midline and thyroid not enlarged, symmetric, no tenderness/mass/nodules Back: symmetric, no curvature. ROM normal. No  CVA tenderness. Lungs: clear to auscultation bilaterally Breasts: normal appearance, no masses or tenderness Heart: regular rate and rhythm, S1, S2 normal, no murmur, click, rub or gallop Abdomen: soft, non-tender; bowel sounds  normal; no masses,  no organomegaly Pelvic: deferred--gyn Extremities: extremities normal, atraumatic, no cyanosis or edema Pulses: 2+ and symmetric Skin: Skin color, texture, turgor normal. No rashes or lesions Lymph nodes: Cervical, supraclavicular, and axillary nodes normal. Neurologic: Alert and oriented X 3, normal strength and tone. Normal symmetric reflexes. Normal coordination and gait Psych-- no depression, no anxiety      Assessment:    Healthy female exam.       Plan:    ghm utd Check labs Flu shot refused See After Visit Summary for Counseling Recommendations

## 2014-06-19 NOTE — Patient Instructions (Signed)
Preventive Care for Adults A healthy lifestyle and preventive care can promote health and wellness. Preventive health guidelines for women include the following key practices.  A routine yearly physical is a good way to check with your health care provider about your health and preventive screening. It is a chance to share any concerns and updates on your health and to receive a thorough exam.  Visit your dentist for a routine exam and preventive care every 6 months. Brush your teeth twice a day and floss once a day. Good oral hygiene prevents tooth decay and gum disease.  The frequency of eye exams is based on your age, health, family medical history, use of contact lenses, and other factors. Follow your health care provider's recommendations for frequency of eye exams.  Eat a healthy diet. Foods like vegetables, fruits, whole grains, low-fat dairy products, and lean protein foods contain the nutrients you need without too many calories. Decrease your intake of foods high in solid fats, added sugars, and salt. Eat the right amount of calories for you.Get information about a proper diet from your health care provider, if necessary.  Regular physical exercise is one of the most important things you can do for your health. Most adults should get at least 150 minutes of moderate-intensity exercise (any activity that increases your heart rate and causes you to sweat) each week. In addition, most adults need muscle-strengthening exercises on 2 or more days a week.  Maintain a healthy weight. The body mass index (BMI) is a screening tool to identify possible weight problems. It provides an estimate of body fat based on height and weight. Your health care provider can find your BMI and can help you achieve or maintain a healthy weight.For adults 20 years and older:  A BMI below 18.5 is considered underweight.  A BMI of 18.5 to 24.9 is normal.  A BMI of 25 to 29.9 is considered overweight.  A BMI of  30 and above is considered obese.  Maintain normal blood lipids and cholesterol levels by exercising and minimizing your intake of saturated fat. Eat a balanced diet with plenty of fruit and vegetables. Blood tests for lipids and cholesterol should begin at age 76 and be repeated every 5 years. If your lipid or cholesterol levels are high, you are over 50, or you are at high risk for heart disease, you may need your cholesterol levels checked more frequently.Ongoing high lipid and cholesterol levels should be treated with medicines if diet and exercise are not working.  If you smoke, find out from your health care provider how to quit. If you do not use tobacco, do not start.  Lung cancer screening is recommended for adults aged 22-80 years who are at high risk for developing lung cancer because of a history of smoking. A yearly low-dose CT scan of the lungs is recommended for people who have at least a 30-pack-year history of smoking and are a current smoker or have quit within the past 15 years. A pack year of smoking is smoking an average of 1 pack of cigarettes a day for 1 year (for example: 1 pack a day for 30 years or 2 packs a day for 15 years). Yearly screening should continue until the smoker has stopped smoking for at least 15 years. Yearly screening should be stopped for people who develop a health problem that would prevent them from having lung cancer treatment.  If you are pregnant, do not drink alcohol. If you are breastfeeding,  be very cautious about drinking alcohol. If you are not pregnant and choose to drink alcohol, do not have more than 1 drink per day. One drink is considered to be 12 ounces (355 mL) of beer, 5 ounces (148 mL) of wine, or 1.5 ounces (44 mL) of liquor.  Avoid use of street drugs. Do not share needles with anyone. Ask for help if you need support or instructions about stopping the use of drugs.  High blood pressure causes heart disease and increases the risk of  stroke. Your blood pressure should be checked at least every 1 to 2 years. Ongoing high blood pressure should be treated with medicines if weight loss and exercise do not work.  If you are 75-52 years old, ask your health care provider if you should take aspirin to prevent strokes.  Diabetes screening involves taking a blood sample to check your fasting blood sugar level. This should be done once every 3 years, after age 15, if you are within normal weight and without risk factors for diabetes. Testing should be considered at a younger age or be carried out more frequently if you are overweight and have at least 1 risk factor for diabetes.  Breast cancer screening is essential preventive care for women. You should practice "breast self-awareness." This means understanding the normal appearance and feel of your breasts and may include breast self-examination. Any changes detected, no matter how small, should be reported to a health care provider. Women in their 58s and 30s should have a clinical breast exam (CBE) by a health care provider as part of a regular health exam every 1 to 3 years. After age 16, women should have a CBE every year. Starting at age 53, women should consider having a mammogram (breast X-ray test) every year. Women who have a family history of breast cancer should talk to their health care provider about genetic screening. Women at a high risk of breast cancer should talk to their health care providers about having an MRI and a mammogram every year.  Breast cancer gene (BRCA)-related cancer risk assessment is recommended for women who have family members with BRCA-related cancers. BRCA-related cancers include breast, ovarian, tubal, and peritoneal cancers. Having family members with these cancers may be associated with an increased risk for harmful changes (mutations) in the breast cancer genes BRCA1 and BRCA2. Results of the assessment will determine the need for genetic counseling and  BRCA1 and BRCA2 testing.  Routine pelvic exams to screen for cancer are no longer recommended for nonpregnant women who are considered low risk for cancer of the pelvic organs (ovaries, uterus, and vagina) and who do not have symptoms. Ask your health care provider if a screening pelvic exam is right for you.  If you have had past treatment for cervical cancer or a condition that could lead to cancer, you need Pap tests and screening for cancer for at least 20 years after your treatment. If Pap tests have been discontinued, your risk factors (such as having a new sexual partner) need to be reassessed to determine if screening should be resumed. Some women have medical problems that increase the chance of getting cervical cancer. In these cases, your health care provider may recommend more frequent screening and Pap tests.  The HPV test is an additional test that may be used for cervical cancer screening. The HPV test looks for the virus that can cause the cell changes on the cervix. The cells collected during the Pap test can be  tested for HPV. The HPV test could be used to screen women aged 30 years and older, and should be used in women of any age who have unclear Pap test results. After the age of 30, women should have HPV testing at the same frequency as a Pap test.  Colorectal cancer can be detected and often prevented. Most routine colorectal cancer screening begins at the age of 50 years and continues through age 75 years. However, your health care provider may recommend screening at an earlier age if you have risk factors for colon cancer. On a yearly basis, your health care provider may provide home test kits to check for hidden blood in the stool. Use of a small camera at the end of a tube, to directly examine the colon (sigmoidoscopy or colonoscopy), can detect the earliest forms of colorectal cancer. Talk to your health care provider about this at age 50, when routine screening begins. Direct  exam of the colon should be repeated every 5-10 years through age 75 years, unless early forms of pre-cancerous polyps or small growths are found.  People who are at an increased risk for hepatitis B should be screened for this virus. You are considered at high risk for hepatitis B if:  You were born in a country where hepatitis B occurs often. Talk with your health care provider about which countries are considered high risk.  Your parents were born in a high-risk country and you have not received a shot to protect against hepatitis B (hepatitis B vaccine).  You have HIV or AIDS.  You use needles to inject street drugs.  You live with, or have sex with, someone who has hepatitis B.  You get hemodialysis treatment.  You take certain medicines for conditions like cancer, organ transplantation, and autoimmune conditions.  Hepatitis C blood testing is recommended for all people born from 1945 through 1965 and any individual with known risks for hepatitis C.  Practice safe sex. Use condoms and avoid high-risk sexual practices to reduce the spread of sexually transmitted infections (STIs). STIs include gonorrhea, chlamydia, syphilis, trichomonas, herpes, HPV, and human immunodeficiency virus (HIV). Herpes, HIV, and HPV are viral illnesses that have no cure. They can result in disability, cancer, and death.  You should be screened for sexually transmitted illnesses (STIs) including gonorrhea and chlamydia if:  You are sexually active and are younger than 24 years.  You are older than 24 years and your health care provider tells you that you are at risk for this type of infection.  Your sexual activity has changed since you were last screened and you are at an increased risk for chlamydia or gonorrhea. Ask your health care provider if you are at risk.  If you are at risk of being infected with HIV, it is recommended that you take a prescription medicine daily to prevent HIV infection. This is  called preexposure prophylaxis (PrEP). You are considered at risk if:  You are a heterosexual woman, are sexually active, and are at increased risk for HIV infection.  You take drugs by injection.  You are sexually active with a partner who has HIV.  Talk with your health care provider about whether you are at high risk of being infected with HIV. If you choose to begin PrEP, you should first be tested for HIV. You should then be tested every 3 months for as long as you are taking PrEP.  Osteoporosis is a disease in which the bones lose minerals and strength   with aging. This can result in serious bone fractures or breaks. The risk of osteoporosis can be identified using a bone density scan. Women ages 65 years and over and women at risk for fractures or osteoporosis should discuss screening with their health care providers. Ask your health care provider whether you should take a calcium supplement or vitamin D to reduce the rate of osteoporosis.  Menopause can be associated with physical symptoms and risks. Hormone replacement therapy is available to decrease symptoms and risks. You should talk to your health care provider about whether hormone replacement therapy is right for you.  Use sunscreen. Apply sunscreen liberally and repeatedly throughout the day. You should seek shade when your shadow is shorter than you. Protect yourself by wearing long sleeves, pants, a wide-brimmed hat, and sunglasses year round, whenever you are outdoors.  Once a month, do a whole body skin exam, using a mirror to look at the skin on your back. Tell your health care provider of new moles, moles that have irregular borders, moles that are larger than a pencil eraser, or moles that have changed in shape or color.  Stay current with required vaccines (immunizations).  Influenza vaccine. All adults should be immunized every year.  Tetanus, diphtheria, and acellular pertussis (Td, Tdap) vaccine. Pregnant women should  receive 1 dose of Tdap vaccine during each pregnancy. The dose should be obtained regardless of the length of time since the last dose. Immunization is preferred during the 27th-36th week of gestation. An adult who has not previously received Tdap or who does not know her vaccine status should receive 1 dose of Tdap. This initial dose should be followed by tetanus and diphtheria toxoids (Td) booster doses every 10 years. Adults with an unknown or incomplete history of completing a 3-dose immunization series with Td-containing vaccines should begin or complete a primary immunization series including a Tdap dose. Adults should receive a Td booster every 10 years.  Varicella vaccine. An adult without evidence of immunity to varicella should receive 2 doses or a second dose if she has previously received 1 dose. Pregnant females who do not have evidence of immunity should receive the first dose after pregnancy. This first dose should be obtained before leaving the health care facility. The second dose should be obtained 4-8 weeks after the first dose.  Human papillomavirus (HPV) vaccine. Females aged 13-26 years who have not received the vaccine previously should obtain the 3-dose series. The vaccine is not recommended for use in pregnant females. However, pregnancy testing is not needed before receiving a dose. If a female is found to be pregnant after receiving a dose, no treatment is needed. In that case, the remaining doses should be delayed until after the pregnancy. Immunization is recommended for any person with an immunocompromised condition through the age of 26 years if she did not get any or all doses earlier. During the 3-dose series, the second dose should be obtained 4-8 weeks after the first dose. The third dose should be obtained 24 weeks after the first dose and 16 weeks after the second dose.  Zoster vaccine. One dose is recommended for adults aged 60 years or older unless certain conditions are  present.  Measles, mumps, and rubella (MMR) vaccine. Adults born before 1957 generally are considered immune to measles and mumps. Adults born in 1957 or later should have 1 or more doses of MMR vaccine unless there is a contraindication to the vaccine or there is laboratory evidence of immunity to   each of the three diseases. A routine second dose of MMR vaccine should be obtained at least 28 days after the first dose for students attending postsecondary schools, health care workers, or international travelers. People who received inactivated measles vaccine or an unknown type of measles vaccine during 1963-1967 should receive 2 doses of MMR vaccine. People who received inactivated mumps vaccine or an unknown type of mumps vaccine before 1979 and are at high risk for mumps infection should consider immunization with 2 doses of MMR vaccine. For females of childbearing age, rubella immunity should be determined. If there is no evidence of immunity, females who are not pregnant should be vaccinated. If there is no evidence of immunity, females who are pregnant should delay immunization until after pregnancy. Unvaccinated health care workers born before 1957 who lack laboratory evidence of measles, mumps, or rubella immunity or laboratory confirmation of disease should consider measles and mumps immunization with 2 doses of MMR vaccine or rubella immunization with 1 dose of MMR vaccine.  Pneumococcal 13-valent conjugate (PCV13) vaccine. When indicated, a person who is uncertain of her immunization history and has no record of immunization should receive the PCV13 vaccine. An adult aged 19 years or older who has certain medical conditions and has not been previously immunized should receive 1 dose of PCV13 vaccine. This PCV13 should be followed with a dose of pneumococcal polysaccharide (PPSV23) vaccine. The PPSV23 vaccine dose should be obtained at least 8 weeks after the dose of PCV13 vaccine. An adult aged 19  years or older who has certain medical conditions and previously received 1 or more doses of PPSV23 vaccine should receive 1 dose of PCV13. The PCV13 vaccine dose should be obtained 1 or more years after the last PPSV23 vaccine dose.  Pneumococcal polysaccharide (PPSV23) vaccine. When PCV13 is also indicated, PCV13 should be obtained first. All adults aged 65 years and older should be immunized. An adult younger than age 65 years who has certain medical conditions should be immunized. Any person who resides in a nursing home or long-term care facility should be immunized. An adult smoker should be immunized. People with an immunocompromised condition and certain other conditions should receive both PCV13 and PPSV23 vaccines. People with human immunodeficiency virus (HIV) infection should be immunized as soon as possible after diagnosis. Immunization during chemotherapy or radiation therapy should be avoided. Routine use of PPSV23 vaccine is not recommended for American Indians, [place] Natives, or people younger than 65 years unless there are medical conditions that require PPSV23 vaccine. When indicated, people who have unknown immunization and have no record of immunization should receive PPSV23 vaccine. One-time revaccination 5 years after the first dose of PPSV23 is recommended for people aged 19-64 years who have chronic kidney failure, nephrotic syndrome, asplenia, or immunocompromised conditions. People who received 1-2 doses of PPSV23 before age 65 years should receive another dose of PPSV23 vaccine at age 65 years or later if at least 5 years have passed since the previous dose. Doses of PPSV23 are not needed for people immunized with PPSV23 at or after age 65 years.  Meningococcal vaccine. Adults with asplenia or persistent complement component deficiencies should receive 2 doses of quadrivalent meningococcal conjugate (MenACWY-D) vaccine. The doses should be obtained at least 2 months apart.  Microbiologists working with certain meningococcal bacteria, military recruits, people at risk during an outbreak, and people who travel to or live in countries with a high rate of meningitis should be immunized. A first-year college student up through age   21 years who is living in a residence hall should receive a dose if she did not receive a dose on or after her 16th birthday. Adults who have certain high-risk conditions should receive one or more doses of vaccine.  Hepatitis A vaccine. Adults who wish to be protected from this disease, have certain high-risk conditions, work with hepatitis A-infected animals, work in hepatitis A research labs, or travel to or work in countries with a high rate of hepatitis A should be immunized. Adults who were previously unvaccinated and who anticipate close contact with an international adoptee during the first 60 days after arrival in the [place] from a country with a high rate of hepatitis A should be immunized.  Hepatitis B vaccine. Adults who wish to be protected from this disease, have certain high-risk conditions, may be exposed to blood or other infectious body fluids, are household contacts or sex partners of hepatitis B positive people, are clients or workers in certain care facilities, or travel to or work in countries with a high rate of hepatitis B should be immunized.  Haemophilus influenzae type b (Hib) vaccine. A previously unvaccinated person with asplenia or sickle cell disease or having a scheduled splenectomy should receive 1 dose of Hib vaccine. Regardless of previous immunization, a recipient of a hematopoietic stem cell transplant should receive a 3-dose series 6-12 months after her successful transplant. Hib vaccine is not recommended for adults with HIV infection. Preventive Services / Frequency Ages 64 to 68 years  Blood pressure check.** / Every 1 to 2 years.  Lipid and cholesterol check.** / Every 5 years beginning at age  22.  Clinical breast exam.** / Every 3 years for women in their 88s and 53s.  BRCA-related cancer risk assessment.** / For women who have family members with a BRCA-related cancer (breast, ovarian, tubal, or peritoneal cancers).  Pap test.** / Every 2 years from ages 90 through 51. Every 3 years starting at age 21 through age 56 or 3 with a history of 3 consecutive normal Pap tests.  HPV screening.** / Every 3 years from ages 24 through ages 1 to 46 with a history of 3 consecutive normal Pap tests.  Hepatitis C blood test.** / For any individual with known risks for hepatitis C.  Skin self-exam. / Monthly.  Influenza vaccine. / Every year.  Tetanus, diphtheria, and acellular pertussis (Tdap, Td) vaccine.** / Consult your health care provider. Pregnant women should receive 1 dose of Tdap vaccine during each pregnancy. 1 dose of Td every 10 years.  Varicella vaccine.** / Consult your health care provider. Pregnant females who do not have evidence of immunity should receive the first dose after pregnancy.  HPV vaccine. / 3 doses over 6 months, if 72 and younger. The vaccine is not recommended for use in pregnant females. However, pregnancy testing is not needed before receiving a dose.  Measles, mumps, rubella (MMR) vaccine.** / You need at least 1 dose of MMR if you were born in 1957 or later. You may also need a 2nd dose. For females of childbearing age, rubella immunity should be determined. If there is no evidence of immunity, females who are not pregnant should be vaccinated. If there is no evidence of immunity, females who are pregnant should delay immunization until after pregnancy.  Pneumococcal 13-valent conjugate (PCV13) vaccine.** / Consult your health care provider.  Pneumococcal polysaccharide (PPSV23) vaccine.** / 1 to 2 doses if you smoke cigarettes or if you have certain conditions.  Meningococcal vaccine.** /  1 dose if you are age 19 to 21 years and a first-year college  student living in a residence hall, or have one of several medical conditions, you need to get vaccinated against meningococcal disease. You may also need additional booster doses.  Hepatitis A vaccine.** / Consult your health care provider.  Hepatitis B vaccine.** / Consult your health care provider.  Haemophilus influenzae type b (Hib) vaccine.** / Consult your health care provider. Ages 40 to 64 years  Blood pressure check.** / Every 1 to 2 years.  Lipid and cholesterol check.** / Every 5 years beginning at age 20 years.  Lung cancer screening. / Every year if you are aged 55-80 years and have a 30-pack-year history of smoking and currently smoke or have quit within the past 15 years. Yearly screening is stopped once you have quit smoking for at least 15 years or develop a health problem that would prevent you from having lung cancer treatment.  Clinical breast exam.** / Every year after age 40 years.  BRCA-related cancer risk assessment.** / For women who have family members with a BRCA-related cancer (breast, ovarian, tubal, or peritoneal cancers).  Mammogram.** / Every year beginning at age 40 years and continuing for as long as you are in good health. Consult with your health care provider.  Pap test.** / Every 3 years starting at age 30 years through age 65 or 70 years with a history of 3 consecutive normal Pap tests.  HPV screening.** / Every 3 years from ages 30 years through ages 65 to 70 years with a history of 3 consecutive normal Pap tests.  Fecal occult blood test (FOBT) of stool. / Every year beginning at age 50 years and continuing until age 75 years. You may not need to do this test if you get a colonoscopy every 10 years.  Flexible sigmoidoscopy or colonoscopy.** / Every 5 years for a flexible sigmoidoscopy or every 10 years for a colonoscopy beginning at age 50 years and continuing until age 75 years.  Hepatitis C blood test.** / For all people born from 1945 through  1965 and any individual with known risks for hepatitis C.  Skin self-exam. / Monthly.  Influenza vaccine. / Every year.  Tetanus, diphtheria, and acellular pertussis (Tdap/Td) vaccine.** / Consult your health care provider. Pregnant women should receive 1 dose of Tdap vaccine during each pregnancy. 1 dose of Td every 10 years.  Varicella vaccine.** / Consult your health care provider. Pregnant females who do not have evidence of immunity should receive the first dose after pregnancy.  Zoster vaccine.** / 1 dose for adults aged 60 years or older.  Measles, mumps, rubella (MMR) vaccine.** / You need at least 1 dose of MMR if you were born in 1957 or later. You may also need a 2nd dose. For females of childbearing age, rubella immunity should be determined. If there is no evidence of immunity, females who are not pregnant should be vaccinated. If there is no evidence of immunity, females who are pregnant should delay immunization until after pregnancy.  Pneumococcal 13-valent conjugate (PCV13) vaccine.** / Consult your health care provider.  Pneumococcal polysaccharide (PPSV23) vaccine.** / 1 to 2 doses if you smoke cigarettes or if you have certain conditions.  Meningococcal vaccine.** / Consult your health care provider.  Hepatitis A vaccine.** / Consult your health care provider.  Hepatitis B vaccine.** / Consult your health care provider.  Haemophilus influenzae type b (Hib) vaccine.** / Consult your health care provider. Ages 65   years and over  Blood pressure check.** / Every 1 to 2 years.  Lipid and cholesterol check.** / Every 5 years beginning at age 22 years.  Lung cancer screening. / Every year if you are aged 73-80 years and have a 30-pack-year history of smoking and currently smoke or have quit within the past 15 years. Yearly screening is stopped once you have quit smoking for at least 15 years or develop a health problem that would prevent you from having lung cancer  treatment.  Clinical breast exam.** / Every year after age 4 years.  BRCA-related cancer risk assessment.** / For women who have family members with a BRCA-related cancer (breast, ovarian, tubal, or peritoneal cancers).  Mammogram.** / Every year beginning at age 40 years and continuing for as long as you are in good health. Consult with your health care provider.  Pap test.** / Every 3 years starting at age 9 years through age 34 or 91 years with 3 consecutive normal Pap tests. Testing can be stopped between 65 and 70 years with 3 consecutive normal Pap tests and no abnormal Pap or HPV tests in the past 10 years.  HPV screening.** / Every 3 years from ages 57 years through ages 64 or 45 years with a history of 3 consecutive normal Pap tests. Testing can be stopped between 65 and 70 years with 3 consecutive normal Pap tests and no abnormal Pap or HPV tests in the past 10 years.  Fecal occult blood test (FOBT) of stool. / Every year beginning at age 15 years and continuing until age 17 years. You may not need to do this test if you get a colonoscopy every 10 years.  Flexible sigmoidoscopy or colonoscopy.** / Every 5 years for a flexible sigmoidoscopy or every 10 years for a colonoscopy beginning at age 86 years and continuing until age 71 years.  Hepatitis C blood test.** / For all people born from [birth year] through 1965 and any individual with known risks for hepatitis C.  Osteoporosis screening.** / A one-time screening for women ages 83 years and over and women at risk for fractures or osteoporosis.  Skin self-exam. / Monthly.  Influenza vaccine. / Every year.  Tetanus, diphtheria, and acellular pertussis (Tdap/Td) vaccine.** / 1 dose of Td every 10 years.  Varicella vaccine.** / Consult your health care provider.  Zoster vaccine.** / 1 dose for adults aged 61 years or older.  Pneumococcal 13-valent conjugate (PCV13) vaccine.** / Consult your health care provider.  Pneumococcal  polysaccharide (PPSV23) vaccine.** / 1 dose for all adults aged 28 years and older.  Meningococcal vaccine.** / Consult your health care provider.  Hepatitis A vaccine.** / Consult your health care provider.  Hepatitis B vaccine.** / Consult your health care provider.  Haemophilus influenzae type b (Hib) vaccine.** / Consult your health care provider. ** Family history and personal history of risk and conditions may change your health care provider's recommendations. Document Released: 07/26/2001 Document Revised: 10/14/2013 Document Reviewed: 10/25/2010 [facility] Patient Information 2015 [place], [place]. This information is not intended to replace advice given to you by your health care provider. Make sure you discuss any questions you have with your health care provider.

## 2014-06-19 NOTE — Progress Notes (Signed)
Pre visit review using our clinic review tool, if applicable. No additional management support is needed unless otherwise documented below in the visit note. 

## 2014-08-25 ENCOUNTER — Ambulatory Visit (INDEPENDENT_AMBULATORY_CARE_PROVIDER_SITE_OTHER): Payer: 59 | Admitting: Medical

## 2014-08-25 ENCOUNTER — Encounter: Payer: Self-pay | Admitting: Medical

## 2014-08-25 VITALS — BP 125/74 | HR 72 | Temp 98.2°F | Ht 60.5 in | Wt 175.8 lb

## 2014-08-25 DIAGNOSIS — J111 Influenza due to unidentified influenza virus with other respiratory manifestations: Secondary | ICD-10-CM

## 2014-08-25 DIAGNOSIS — L309 Dermatitis, unspecified: Secondary | ICD-10-CM

## 2014-08-25 DIAGNOSIS — J029 Acute pharyngitis, unspecified: Secondary | ICD-10-CM | POA: Insufficient documentation

## 2014-08-25 DIAGNOSIS — M791 Myalgia: Secondary | ICD-10-CM

## 2014-08-25 DIAGNOSIS — M609 Myositis, unspecified: Secondary | ICD-10-CM

## 2014-08-25 DIAGNOSIS — IMO0001 Reserved for inherently not codable concepts without codable children: Secondary | ICD-10-CM

## 2014-08-25 DIAGNOSIS — T7840XA Allergy, unspecified, initial encounter: Secondary | ICD-10-CM

## 2014-08-25 LAB — POCT RAPID STREP A (OFFICE): Rapid Strep A Screen: NEGATIVE

## 2014-08-25 LAB — POCT INFLUENZA A/B
Influenza A, POC: POSITIVE
Influenza B, POC: NEGATIVE

## 2014-08-25 MED ORDER — HYDROXYZINE HCL 25 MG PO TABS: 25.0000 mg | ORAL_TABLET | Freq: Three times a day (TID) | ORAL | Status: DC | PRN

## 2014-08-25 MED ORDER — OSELTAMIVIR PHOSPHATE 75 MG PO CAPS: 75.0000 mg | ORAL_CAPSULE | Freq: Two times a day (BID) | ORAL | Status: DC

## 2014-08-25 MED ORDER — CEPHALEXIN 500 MG PO CAPS: 500.0000 mg | ORAL_CAPSULE | Freq: Three times a day (TID) | ORAL | Status: DC

## 2014-08-25 NOTE — Progress Notes (Signed)
Pre visit review using our clinic review tool, if applicable. No additional management support is needed unless otherwise documented below in the visit note. 

## 2014-08-25 NOTE — Patient Instructions (Addendum)
Myalgia and myositis With some cough recently. Will do a flu test and rapid strep.  Flu +, strep -     Dermatitis Etiology undetermined. You have been on steroid already. Antibiotic may help areas of follicultits. I am prescribing hydroxyzine for the itching.  I will refer you to allergist for skin testing since dermatology treatment did not resolve symptoms.    Influenza You have the flu. I am treating you with tamiflu. Rest, hydrate and take tylenol for fever. Alternate ibuprofen as discussed  if necessary for body ache or fever. You should gradually improve but  if you develop secondary bacterial infection signs and symptoms  please notify us        Follow up in 7-10 days or as needed.

## 2014-08-25 NOTE — Assessment & Plan Note (Addendum)
With some cough recently. Will do a flu test and rapid strep.  Flu +, strep -

## 2014-08-25 NOTE — Progress Notes (Signed)
Subjective:    Patient ID: Elizabeth [name], female    DOB: [birth year], 59 y.o.   MRN: [id number]  HPI  Pt in with body aches Friday, Saturday and Sunday. Some fever and chills this weekend. Some cough this weekend. No family members sick. This morning felt ok. Then some body aches. Pain started with myalgia again this afternon  and took alleve.    Pt has scattered rash on her body about a month or more. Pt given ointment by dermatologist. Pt was given some steroid for about one week. Pt never given antibiotic. Pt denies any major stress recently.   Pt states dermatolgisit told her some eczema. Pt has appointment upcoming with allergist.   Review of Systems  Constitutional: Negative for fever, chills and fatigue.  HENT: Positive for sinus pressure. Negative for congestion, mouth sores, nosebleeds, rhinorrhea, sneezing, sore throat and tinnitus.   Respiratory: Positive for cough. Negative for chest tightness and wheezing.   Cardiovascular: Negative for chest pain and palpitations.  Gastrointestinal: Negative for abdominal pain and abdominal distention.  Musculoskeletal: Positive for myalgias. Negative for joint swelling, neck pain and neck stiffness.  Skin: Positive for rash.  Neurological: Negative for dizziness, facial asymmetry and numbness.  Hematological: Negative for adenopathy. Does not bruise/bleed easily.   Past Medical History  Diagnosis Date  . Acne   . Eczema     History   Social History  . Marital Status: Single    Spouse Name: N/A  . Number of Children: 1  . Years of Education: 14   Occupational History  . accounting [place]   Social History Main Topics  . Smoking status: Never Smoker   . Smokeless tobacco: Never Used  . Alcohol Use: No  . Drug Use: No  . Sexual Activity: No   Other Topics Concern  . Not on file   Social History Narrative   Exercise--- no    Past Surgical History  Procedure Laterality Date  . Tubal ligation       Family History  Problem Relation Age of Onset  . Breast cancer Sister   . Cancer Sister 87    breast  . Breast cancer Maternal Aunt   . Hepatitis Mother 19    hep C  . Heart disease Father 4    MI  . Aneurysm Maternal Grandfather     No Known Allergies  Current Outpatient Prescriptions on File Prior to Visit  Medication Sig Dispense Refill  . desonide (DESOWEN) 0.05 % lotion Apply topically 2 (two) times daily. 59 mL 3   No current facility-administered medications on file prior to visit.    BP 125/74 mmHg  Pulse 72  Temp(Src) 98.2 F (36.8 C) (Oral)  Ht 5' 0.5" (1.537 m)  Wt 175 lb 12.8 oz (79.742 kg)  BMI 33.76 kg/m2  SpO2 95%      Objective:   Physical Exam  General  Mental Status - Alert. General Appearance - Well groomed. Not in acute distress.  Skin Rashes- No Rashes.  HEENT Head- Normal. Ear Auditory Canal - Left- Normal. Right - Normal.Tympanic Membrane- Left- Normal. Right- Normal. Eye Sclera/Conjunctiva- Left- Normal. Right- Normal. Nose & Sinuses Nasal Mucosa- Left-  Mild boggy + Congested. Right-  Mild   boggy + Congested. Mouth & Throat Lips: Upper Lip- Normal: no dryness, cracking, pallor, cyanosis, or vesicular eruption. Lower Lip-Normal: no dryness, cracking, pallor, cyanosis or vesicular eruption. Buccal Mucosa- Bilateral- No Aphthous ulcers. Oropharynx- No Discharge or Erythema. Tonsils: Characteristics-  Bilateral-faint  Erythema + Congestion. Size/Enlargement- Bilateral- No enlargement. Discharge- bilateral-None.  Neck Neck- Supple. No Masses.   Chest and Lung Exam Auscultation: Breath Sounds:- even and unlabored, .  Cardiovascular Auscultation:Rythm- Regular, rate and rhythm. Murmurs & Other Heart Sounds:Ausculatation of the heart reveal- No Murmurs.  Lymphatic Head & Neck General Head & Neck Lymphatics: Bilateral: Description- No Localized lymphadenopathy.  Skin- Scattered rash over abdomen upper chest and back. Some  scattered follicultits upper back and braline region.     Assessment & Plan:

## 2014-08-25 NOTE — Assessment & Plan Note (Deleted)
By exam this appears to be the case. Rx cephalexin. This woud treat potential strep. And also would help with some folliculitis present on skin exam.

## 2014-08-25 NOTE — Assessment & Plan Note (Signed)
Etiology undetermined. You have been on steroid already. Antibiotic may help areas of follicultits. I am prescribing hydroxyzine for the itching.  I will refer you to allergist for skin testing since dermatology treatment did not resolve symptoms.

## 2014-08-25 NOTE — Assessment & Plan Note (Addendum)
You have the flu. I am treating you with tamiflu. Rest, hydrate and take tylenol for fever. Alternate ibuprofen as discussed  if necessary for body ache or fever. You should gradually improve but  if you develop secondary bacterial infection signs and symptoms  please notify us

## 2014-09-14 ENCOUNTER — Other Ambulatory Visit: Payer: Self-pay | Admitting: Family Medicine

## 2014-10-02 ENCOUNTER — Telehealth: Payer: Self-pay | Admitting: *Deleted

## 2014-10-02 NOTE — Telephone Encounter (Signed)
Pt dropped off wellness screening form for [employer]. Form filled out and forwarded to Dr. Etter Sjogren for signature. JG//CMA

## 2014-10-07 ENCOUNTER — Ambulatory Visit: Payer: 59 | Admitting: Internal Medicine

## 2014-10-08 ENCOUNTER — Telehealth: Payer: Self-pay | Admitting: Family

## 2014-10-08 ENCOUNTER — Ambulatory Visit (INDEPENDENT_AMBULATORY_CARE_PROVIDER_SITE_OTHER): Payer: 59 | Admitting: Internal Medicine

## 2014-10-08 ENCOUNTER — Encounter: Payer: Self-pay | Admitting: Internal Medicine

## 2014-10-08 VITALS — BP 100/74 | HR 80 | Temp 98.1°F | Ht 61.0 in | Wt 176.5 lb

## 2014-10-08 DIAGNOSIS — L2089 Other atopic dermatitis: Secondary | ICD-10-CM

## 2014-10-08 DIAGNOSIS — L111 Transient acantholytic dermatosis [Grover]: Secondary | ICD-10-CM

## 2014-10-08 MED ORDER — PREDNISONE 20 MG PO TABS: 20.0000 mg | ORAL_TABLET | Freq: Two times a day (BID) | ORAL | Status: DC

## 2014-10-08 MED ORDER — RANITIDINE HCL 150 MG PO TABS: 150.0000 mg | ORAL_TABLET | Freq: Two times a day (BID) | ORAL | Status: DC

## 2014-10-08 NOTE — Progress Notes (Signed)
Pre visit review using our clinic review tool, if applicable. No additional management support is needed unless otherwise documented below in the visit note. 

## 2014-10-08 NOTE — Patient Instructions (Signed)
Avoid soaps and cosmetics which are not hypoallergenic. Restrict hyperallergenic foods at this time: Nuts, strawberries, seafood , chocolate, and tomatoes. Take the ranitidine 30 minutes before breakfast and evening meal . This blocks a histamine pathway as we discussed.

## 2014-10-08 NOTE — Telephone Encounter (Signed)
Patient was in today and Marya Amsler had said something about an ear infection during exam but never said anything else about it. Should she be treated for the ear infection. Please advise patient

## 2014-10-08 NOTE — Progress Notes (Signed)
   Subjective:    Patient ID: Elizabeth [name], female    DOB: 1956/03/23, 59 y.o.   MRN: [id number]  HPI She has had a  rash involving her body diffusely, sparing only the lower extremities below the mid thighs for 2 months. There was no specific trigger or exposure prior to onset such as new clothing; chemical exposure; vector bite; change in diet;or being overheated or sweating. She has seen Dr Tonia Brooms, Dermatologist and diagnosed with "eczema". She was seen by ENT who diagnosed allergy to tree pollen and mold. She has not started immunotherapy. She is now using Cetaphil lotion and liquid soap.  She does have some itchy watery eyes but no other extrinsic symptoms.  Review of Systems  Swelling of the lips or tongue or intraoral lesions denied.  Shortness of breath, wheezing, or cough absent.  No vesicles, pustules  noted.  Fever ,chills , or sweats denied.   Diarrhea not present.  No dysuria, pyuria or hematuria.    Objective:   Physical Exam Pertinent or positive findings include: Tympanic membranes appear scarred.  The right earlobe is post traumatically bilobed.  Abdomen slightly protuberant.  She has diffuse plaque-like dermatitis across the epigastric area as well as at the waist level.  She has papular lesions over the right upper extremity which blanch with pressure.  The rash on the left upper extremity is more diffuse and erythematous and ill-defined.   General appearance :adequately nourished; in no distress. Eyes: No conjunctival inflammation or scleral icterus is present. Oral exam:  Lips and gums are healthy appearing.There is no oropharyngeal erythema or exudate noted. Dental hygiene is good. Heart:  Normal rate and regular rhythm. S1 and S2 normal without gallop, murmur, click, rub or other extra sounds  Lungs:Chest clear to auscultation; no wheezes, rhonchi,rales ,or rubs present.No increased work of breathing.  Abdomen: bowel sounds normal, soft and non-tender  without masses, organomegaly or hernias noted.  No guarding or rebound.  Vascular : all pulses equal ; no bruits present. Skin:Warm & dry;no tenting . Lymphatic: No lymphadenopathy is noted about the head, neck, axilla Neuro: Strength, tone  normal.         Assessment & Plan:  #1 pleuritic papular plaque-like dermatitis most striking over the upper abdomen; rule out Grover's disease  Plan: See orders and recommendations. Skin biopsy will probably be necessary for definitive diagnosis

## 2014-10-09 ENCOUNTER — Telehealth: Payer: Self-pay

## 2014-10-09 NOTE — Telephone Encounter (Signed)
She saw Dr. Linna Darner yesterday and he informed me he said it looked like she had scarring from old ear infections and nothing currently that would need antibiotics.

## 2014-10-09 NOTE — Telephone Encounter (Signed)
-----   Message from Hendricks Limes, MD sent at 10/09/2014  6:54 AM EDT ----- Please FAX office visit results to Dr Crista Luria, Derm

## 2014-10-09 NOTE — Telephone Encounter (Signed)
Information has been faxed to Dr Tonia Brooms at [phone]

## 2014-10-09 NOTE — Telephone Encounter (Signed)
Completed forms received. Called and informed pt. Copy sent for scanning, originals placed up front for pt to pick up. JG//CMA

## 2014-10-27 ENCOUNTER — Ambulatory Visit: Payer: 59 | Admitting: Internal Medicine

## 2014-10-27 DIAGNOSIS — Z0289 Encounter for other administrative examinations: Secondary | ICD-10-CM

## 2014-12-12 ENCOUNTER — Telehealth: Payer: Self-pay | Admitting: Family Medicine

## 2014-12-12 DIAGNOSIS — R21 Rash and other nonspecific skin eruption: Secondary | ICD-10-CM

## 2014-12-12 NOTE — Telephone Encounter (Signed)
Ok to refer to allergy for rash

## 2014-12-12 NOTE — Telephone Encounter (Signed)
Seen for a Rash on 08/25/14 with Percell Miller, she is still having issues and would like to see allergist. Please advise     KP

## 2014-12-12 NOTE — Telephone Encounter (Signed)
Relation to pt: self  Call back number: work # [phone] (leaving at 4:30p) Pharmacy:  Reason for call:  Pt states rash is reoccurring, last office visit 10/08/2014. Pt requesting a referral to allergist and would like food allergy test. Please advise

## 2014-12-12 NOTE — Telephone Encounter (Signed)
VM left advising ref placed.     KP

## 2015-01-13 ENCOUNTER — Encounter: Payer: Self-pay | Admitting: Family Medicine

## 2015-01-13 ENCOUNTER — Ambulatory Visit (INDEPENDENT_AMBULATORY_CARE_PROVIDER_SITE_OTHER): Payer: 59 | Admitting: Family Medicine

## 2015-01-13 VITALS — BP 108/80 | HR 75 | Temp 97.7°F | Ht 60.05 in | Wt 175.4 lb

## 2015-01-13 DIAGNOSIS — R21 Rash and other nonspecific skin eruption: Secondary | ICD-10-CM | POA: Diagnosis not present

## 2015-01-13 DIAGNOSIS — L539 Erythematous condition, unspecified: Secondary | ICD-10-CM

## 2015-01-13 MED ORDER — PREDNISONE 10 MG PO TABS: ORAL_TABLET | ORAL | Status: DC

## 2015-01-13 MED ORDER — METHYLPREDNISOLONE ACETATE 80 MG/ML IJ SUSP
80.0000 mg | Freq: Once | INTRAMUSCULAR | Status: AC
  Administered 2015-01-13: 80 mg via INTRAMUSCULAR

## 2015-01-13 NOTE — Patient Instructions (Signed)

## 2015-01-13 NOTE — Progress Notes (Signed)
Pre visit review using our clinic review tool, if applicable. No additional management support is needed unless otherwise documented below in the visit note. 

## 2015-01-14 ENCOUNTER — Other Ambulatory Visit (INDEPENDENT_AMBULATORY_CARE_PROVIDER_SITE_OTHER): Payer: 59

## 2015-01-14 DIAGNOSIS — R21 Rash and other nonspecific skin eruption: Secondary | ICD-10-CM | POA: Diagnosis not present

## 2015-01-14 LAB — CBC WITH DIFFERENTIAL/PLATELET
Basophils Absolute: 0.1 10*3/uL (ref 0.0–0.1)
Basophils Relative: 0.7 % (ref 0.0–3.0)
EOS PCT: 5.2 % — AB (ref 0.0–5.0)
Eosinophils Absolute: 0.4 10*3/uL (ref 0.0–0.7)
HCT: 41.8 % (ref 36.0–46.0)
Hemoglobin: 14.3 g/dL (ref 12.0–15.0)
Lymphocytes Relative: 26.5 % (ref 12.0–46.0)
Lymphs Abs: 2.2 10*3/uL (ref 0.7–4.0)
MCHC: 34.2 g/dL (ref 30.0–36.0)
MCV: 90.1 fl (ref 78.0–100.0)
Monocytes Absolute: 0.7 10*3/uL (ref 0.1–1.0)
Monocytes Relative: 8.9 % (ref 3.0–12.0)
NEUTROS ABS: 4.9 10*3/uL (ref 1.4–7.7)
NEUTROS PCT: 58.7 % (ref 43.0–77.0)
PLATELETS: 230 10*3/uL (ref 150.0–400.0)
RBC: 4.64 Mil/uL (ref 3.87–5.11)
RDW: 12.8 % (ref 11.5–15.5)
WBC: 8.3 10*3/uL (ref 4.0–10.5)

## 2015-01-14 LAB — BASIC METABOLIC PANEL
BUN: 22 mg/dL (ref 6–23)
CHLORIDE: 102 meq/L (ref 96–112)
CO2: 27 mEq/L (ref 19–32)
CREATININE: 0.87 mg/dL (ref 0.40–1.20)
Calcium: 10.1 mg/dL (ref 8.4–10.5)
GFR: 85.64 mL/min (ref >60.00)
GLUCOSE: 85 mg/dL (ref 70–99)
POTASSIUM: 4.1 meq/L (ref 3.5–5.1)
Sodium: 137 mEq/L (ref 135–145)

## 2015-01-14 LAB — HEPATIC FUNCTION PANEL
ALK PHOS: 56 U/L (ref 39–117)
ALT: 24 U/L (ref 0–35)
AST: 31 U/L (ref 0–37)
Albumin: 4.3 g/dL (ref 3.5–5.2)
Bilirubin, Direct: 0.1 mg/dL (ref 0.0–0.3)
TOTAL PROTEIN: 7.8 g/dL (ref 6.0–8.3)
Total Bilirubin: 0.9 mg/dL (ref 0.2–1.2)

## 2015-01-14 LAB — SEDIMENTATION RATE: SED RATE: 16 mm/h (ref 0–22)

## 2015-01-14 NOTE — Progress Notes (Signed)
Patient ID: Elizabeth [name], female    DOB: [birth year]  Age: 59 y.o. MRN: [id number]    Subjective:  Subjective HPI Elizabeth [name] presents for f/u rash esp on breast. She has been to derm and allergy and has had no response to tx.  She is concerned about breast Cancer because allergist told her it could be cancer.  Review of Systems  Constitutional: Negative for diaphoresis, appetite change, fatigue and unexpected weight change.  Eyes: Negative for pain, redness and visual disturbance.  Respiratory: Negative for cough, chest tightness, shortness of breath and wheezing.   Cardiovascular: Negative for chest pain, palpitations and leg swelling.  Endocrine: Negative for cold intolerance, heat intolerance, polydipsia, polyphagia and polyuria.  Genitourinary: Negative for dysuria, frequency and difficulty urinating.  Skin: Positive for color change and rash. Negative for wound.  Neurological: Negative for dizziness, light-headedness, numbness and headaches.    History Past Medical History  Diagnosis Date  . Acne   . Eczema     She has past surgical history that includes Tubal ligation.   Her family history includes Aneurysm in her maternal grandfather; Breast cancer in her maternal aunt and sister; Cancer (age of onset: 47) in her sister; Heart disease (age of onset: 74) in her father; Hepatitis (age of onset: 2) in her mother.She reports that she has never smoked. She has never used smokeless tobacco. She reports that she does not drink alcohol or use illicit drugs.  No current outpatient prescriptions on file prior to visit.   No current facility-administered medications on file prior to visit.     Objective:  Objective Physical Exam  Constitutional: She is oriented to person, place, and time. She appears well-developed and well-nourished.  HENT:  Head: Normocephalic and atraumatic.  Eyes: Conjunctivae and EOM are normal.  Neck: Normal range of motion. Neck supple. No JVD  present. Carotid bruit is not present. No thyromegaly present.  Cardiovascular: Normal rate, regular rhythm and normal heart sounds.   No murmur heard. Pulmonary/Chest: Effort normal and breath sounds normal. No respiratory distress. She has no wheezes. She has no rales. She exhibits no tenderness.  Musculoskeletal: She exhibits no edema.  Neurological: She is alert and oriented to person, place, and time.  Skin: Rash noted. There is erythema.     Psychiatric: She has a normal mood and affect.   BP 108/80 mmHg  Pulse 75  Temp(Src) 97.7 F (36.5 C) (Oral)  Ht 5' 0.05" (1.525 m)  Wt 175 lb 6.4 oz (79.561 kg)  BMI 34.21 kg/m2  SpO2 97% Wt Readings from Last 3 Encounters:  01/13/15 175 lb 6.4 oz (79.561 kg)  10/08/14 176 lb 8 oz (80.06 kg)  08/25/14 175 lb 12.8 oz (79.742 kg)     Lab Results  Component Value Date   WBC 6.7 06/19/2014   HGB 14.4 06/19/2014   HCT 43.3 06/19/2014   PLT 223.0 06/19/2014   GLUCOSE 95 06/19/2014   CHOL 221* 06/19/2014   TRIG 120.0 06/19/2014   HDL 49.20 06/19/2014   LDLDIRECT 140.8 04/10/2012   LDLCALC 148* 06/19/2014   ALT 30 06/19/2014   AST 31 06/19/2014   NA 139 06/19/2014   K 3.7 06/19/2014   CL 105 06/19/2014   CREATININE 0.9 06/19/2014   BUN 24* 06/19/2014   CO2 27 06/19/2014   TSH 1.02 06/19/2014    No results found.   Assessment & Plan:  Plan I have discontinued Elizabeth [name]'s cephALEXin, hydrOXYzine, oseltamivir, desonide, predniSONE, and ranitidine. I am also  having her start on predniSONE. We administered methylPREDNISolone acetate.  Meds ordered this encounter  Medications  . predniSONE (DELTASONE) 10 MG tablet    Sig: 3 po qd for 3 days then 2 po qd for 3 days the 1 po qd for 3 days    Dispense:  18 tablet    Refill:  0  . methylPREDNISolone acetate (DEPO-MEDROL) injection 80 mg    Sig:     Problem List Items Addressed This Visit    None    Visit Diagnoses    Rash    -  Primary    Relevant Medications     methylPREDNISolone acetate (DEPO-MEDROL) injection 80 mg (Completed)    Other Relevant Orders    Basic metabolic panel    CBC with Differential/Platelet    Hepatic function panel    Sed Rate (ESR)    ANA    Allergen food profile specific IgE    Breast erythema        Relevant Medications    predniSONE (DELTASONE) 10 MG tablet    methylPREDNISolone acetate (DEPO-MEDROL) injection 80 mg (Completed)    Other Relevant Orders    MM Digital Diagnostic Bilat       Follow-up: Return if symptoms worsen or fail to improve.  Garnet Koyanagi, DO

## 2015-01-15 LAB — ANA: Anti Nuclear Antibody(ANA): NEGATIVE

## 2015-01-15 LAB — ALLERGEN FOOD PROFILE SPECIFIC IGE
APPLE: 4.56 kU/L — AB
Chicken IgE: 0.11 kU/L — ABNORMAL HIGH
Corn: 5.22 kU/L — ABNORMAL HIGH
EGG WHITE IGE: 0.1 kU/L — AB
Fish Cod: 0.15 kU/L — ABNORMAL HIGH
IGE (IMMUNOGLOBULIN E), SERUM: 203 kU/L — AB (ref <115)
Milk IgE: <0.1 kU/L
ORANGE: 4.99 kU/L — AB
PEANUT IGE: 7.08 kU/L — AB
SOYBEAN IGE: 4.79 kU/L — AB
Shrimp IgE: 1.46 kU/L — ABNORMAL HIGH
Tomato IgE: 6.31 kU/L — ABNORMAL HIGH
Tuna IgE: <0.1 kU/L
WHEAT IGE: 5.15 kU/L — AB

## 2015-01-28 ENCOUNTER — Encounter: Payer: Self-pay | Admitting: Family Medicine

## 2015-02-12 ENCOUNTER — Telehealth: Payer: Self-pay | Admitting: Family Medicine

## 2015-02-12 NOTE — Telephone Encounter (Signed)
Relation to pt: self  Call back number: mobile [phone] work # [phone]   Reason for call:  Patient requesting a refill predniSONE (DELTASONE) 10 MG tablet  Due to patient still itching.

## 2015-02-12 NOTE — Telephone Encounter (Signed)
Last seen 01/13/15 for a Rash, please advise if this refill is appropropriate.     KP

## 2015-02-12 NOTE — Telephone Encounter (Signed)
What she took should have resolved the rash--- if no better we may need to get her to a different derm---- where did she go ?  Is she ok to [facility] to [facility]?

## 2015-02-13 NOTE — Telephone Encounter (Signed)
MSG left to call the office      KP 

## 2015-02-17 ENCOUNTER — Other Ambulatory Visit: Payer: Self-pay

## 2015-02-17 ENCOUNTER — Other Ambulatory Visit: Payer: Self-pay | Admitting: Family Medicine

## 2015-02-17 DIAGNOSIS — L539 Erythematous condition, unspecified: Secondary | ICD-10-CM

## 2015-02-17 DIAGNOSIS — R21 Rash and other nonspecific skin eruption: Secondary | ICD-10-CM

## 2015-02-17 MED ORDER — PREDNISONE 10 MG PO TABS: ORAL_TABLET | ORAL | Status: DC

## 2015-02-17 NOTE — Telephone Encounter (Signed)
Spoke with patient and he apt is not into Oct 2nd, and she has had another flare up. She said she is itching everywhere and is absolutely frustrated, she said she will take anything to stop the rash and itching.     KP

## 2015-02-17 NOTE — Telephone Encounter (Signed)
Will try to get a sooner appointment ---- refill pred x1

## 2015-02-17 NOTE — Telephone Encounter (Signed)
Patient returning your call best # [phone]

## 2015-02-18 ENCOUNTER — Telehealth: Payer: Self-pay | Admitting: Family Medicine

## 2015-02-18 NOTE — Telephone Encounter (Signed)
Prednisone was sent.       KP

## 2015-02-18 NOTE — Telephone Encounter (Signed)
Pt said anyone at [facility] would be great. Preferably asap.

## 2015-03-10 ENCOUNTER — Telehealth: Payer: Self-pay | Admitting: Family Medicine

## 2015-03-10 NOTE — Telephone Encounter (Signed)
Relation to SJ:WTGR Call back number: [phone]  Pharmacy:  Reason for call:  Patient has a reoccurring rash and has a scheduled dermatologist appointment and would like PCP to advise what cream she can use to hold her over until dermatology appointment. Please advise

## 2015-03-11 NOTE — Telephone Encounter (Signed)
FYI

## 2015-03-11 NOTE — Telephone Encounter (Signed)
otc cortisone cream

## 2015-03-11 NOTE — Telephone Encounter (Addendum)
Forwarded to Dr. Malvin Johns her response.

## 2015-03-12 NOTE — Telephone Encounter (Signed)
Left message for patient regarding OTC Cortisone Cream.

## 2015-05-28 ENCOUNTER — Telehealth: Payer: Self-pay | Admitting: Family Medicine

## 2015-05-28 NOTE — Telephone Encounter (Signed)
Left message for patient to call about flu shot °

## 2015-08-05 NOTE — Telephone Encounter (Signed)
LM for pt to call and schedule flu shot or update records. °

## 2015-08-05 NOTE — Telephone Encounter (Signed)
Per [place] mark as declined - left 2 msgs regarding flu shot

## 2015-08-05 NOTE — Telephone Encounter (Signed)
Marked as declined.

## 2015-08-14 ENCOUNTER — Ambulatory Visit (INDEPENDENT_AMBULATORY_CARE_PROVIDER_SITE_OTHER): Payer: 59 | Admitting: Medical

## 2015-08-14 ENCOUNTER — Encounter: Payer: Self-pay | Admitting: Medical

## 2015-08-14 VITALS — BP 104/68 | HR 78 | Temp 98.8°F | Ht 60.5 in | Wt 174.6 lb

## 2015-08-14 DIAGNOSIS — J111 Influenza due to unidentified influenza virus with other respiratory manifestations: Secondary | ICD-10-CM | POA: Diagnosis not present

## 2015-08-14 DIAGNOSIS — J40 Bronchitis, not specified as acute or chronic: Secondary | ICD-10-CM | POA: Diagnosis not present

## 2015-08-14 LAB — POCT INFLUENZA A/B
INFLUENZA A, POC: NEGATIVE
INFLUENZA B, POC: NEGATIVE

## 2015-08-14 MED ORDER — OSELTAMIVIR PHOSPHATE 75 MG PO CAPS: 75.0000 mg | ORAL_CAPSULE | Freq: Two times a day (BID) | ORAL | Status: DC

## 2015-08-14 MED ORDER — AZITHROMYCIN 250 MG PO TABS: ORAL_TABLET | ORAL | Status: DC

## 2015-08-14 MED ORDER — FLUTICASONE PROPIONATE 50 MCG/ACT NA SUSP: 2.0000 | Freq: Every day | NASAL | Status: DC

## 2015-08-14 NOTE — Progress Notes (Signed)
Subjective:    Patient ID: Elizabeth [name], female    DOB: 1955/10/01, 60 y.o.   MRN: [id number]  HPI  Pt on Sunday night had faint achy then Monday morning diffuse severe achiness all over type. Fatigue as well severe. Mild ha last night. Pt also notes chills and fever.   Pt is coughing up some mucous. No wheezing. No sinus pain but mild nasal congestion   Review of Systems  Constitutional: Positive for fever, chills and fatigue.  Respiratory: Positive for cough. Negative for chest tightness and wheezing.   Cardiovascular: Negative for chest pain and palpitations.  Musculoskeletal: Positive for myalgias.  Neurological: Negative for dizziness and headaches.  Hematological: Negative for adenopathy. Does not bruise/bleed easily.  Psychiatric/Behavioral: Negative for behavioral problems.    Past Medical History  Diagnosis Date  . Acne   . Eczema     Social History   Social History  . Marital Status: Single    Spouse Name: N/A  . Number of Children: 1  . Years of Education: 14   Occupational History  . 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 Smoking status: Never Smoker   . Smokeless tobacco: Never Used  . Alcohol Use: No  . Drug Use: No  . Sexual Activity: No   Other Topics Concern  . Not on file   Social History Narrative   Exercise--- no    Past Surgical History  Procedure Laterality Date  . Tubal ligation      Family History  Problem Relation Age of Onset  . Breast cancer Sister   . Cancer Sister 53    breast  . Breast cancer Maternal Aunt   . Hepatitis Mother 61    hep C  . Heart disease Father 35    MI  . Aneurysm Maternal Grandfather     Allergies  Allergen Reactions  . 2-(Ethylmercuriothio)Benzoic Acid     Other reaction(s): Other (See Comments) Positive patch test  . Hydroxyethyl Methacrylate Rash    Positive patch test  . Other Rash    Ethyl cyanoacrylate- Positive patch test Methyl methacrylate- Positive  patch test    No current outpatient prescriptions on file prior to visit.   No current facility-administered medications on file prior to visit.    BP 104/68 mmHg  Pulse 78  Temp(Src) 98.8 F (37.1 C) (Oral)  Ht 5' 0.5" (1.537 m)  Wt 174 lb 9.6 oz (79.198 kg)  BMI 33.52 kg/m2  SpO2 98%       Objective:   Physical Exam  General  Mental Status - Alert. General Appearance - Well groomed. Not in acute distress.  Skin Rashes- No Rashes.  HEENT Head- Normal. Ear Auditory Canal - Left- Normal. Right - Normal.Tympanic Membrane- Left- Normal. Right- Normal. Eye Sclera/Conjunctiva- Left- Normal. Right- Normal. Nose & Sinuses Nasal Mucosa- Left-  Boggy and Congested. Right-  Boggy and  Congested.Bilateral no  maxillary and  No frontal sinus pressure. Mouth & Throat Lips: Upper Lip- Normal: no dryness, cracking, pallor, cyanosis, or vesicular eruption. Lower Lip-Normal: no dryness, cracking, pallor, cyanosis or vesicular eruption. Buccal Mucosa- Bilateral- No Aphthous ulcers. Oropharynx- No Discharge or Erythema. Tonsils: Characteristics- Bilateral- No Erythema or Congestion. Size/Enlargement- Bilateral- No enlargement. Discharge- bilateral-None.  Neck Neck- Supple. No Masses.   Chest and Lung Exam Auscultation: Breath Sounds:-Clear even and unlabored. Very faint upper lobe rhonchi.  Cardiovascular Auscultation:Rythm- Regular, rate and rhythm. Murmurs & Other Heart Sounds:Ausculatation of the heart reveal- No  Murmurs.  Lymphatic Head & Neck General Head & Neck Lymphatics: Bilateral: Description- No Localized lymphadenopathy.       Assessment & Plan:  Pt presentation very suspicious for the flu based on clinical description. Will go ahead and rx tamiflu.(We discussed you may have past time frame for treatment but due to severity of symptoms decided to rx tamiflu)  Also appears now with secondary infection/bronchitis. Will rx azithromycin antibiotic. Use you  hycodan cough syrup. Flonase rx for nasal congestion  Follow up in 7 days or as needed for worsening or persisting symptoms.   If not improving would get cxr on follow up.

## 2015-08-14 NOTE — Patient Instructions (Addendum)
Your presentation is very suspicious for the flu based on clinical description. Will go ahead and rx tamiflu.(We discussed you may have past time frame for treatment but due to severity of symptoms decided to rx tamiflu)  Also appears now with secondary infection/bronchitis. Will rx azithromycin antibiotic. Use you hycodan cough syrup. Flonase rx for nasal congestion  Follow up in 7 days or as needed for worsening or persisting symptoms.   If not improving would get cxr on follow up.

## 2015-08-14 NOTE — Progress Notes (Signed)
Pre visit review using our clinic review tool, if applicable. No additional management support is needed unless otherwise documented below in the visit note. 

## 2015-09-16 ENCOUNTER — Telehealth: Payer: Self-pay | Admitting: *Deleted

## 2015-09-16 NOTE — Telephone Encounter (Signed)
Unable to reach patient at time of pre-visit call. Call disconnected after 2 rings.

## 2015-09-17 ENCOUNTER — Ambulatory Visit (INDEPENDENT_AMBULATORY_CARE_PROVIDER_SITE_OTHER): Payer: 59 | Admitting: Family Medicine

## 2015-09-17 ENCOUNTER — Encounter: Payer: Self-pay | Admitting: Family Medicine

## 2015-09-17 VITALS — BP 108/68 | HR 61 | Temp 98.2°F | Ht 61.0 in | Wt 172.6 lb

## 2015-09-17 DIAGNOSIS — Z23 Encounter for immunization: Secondary | ICD-10-CM

## 2015-09-17 DIAGNOSIS — B20 Human immunodeficiency virus [HIV] disease: Secondary | ICD-10-CM | POA: Diagnosis not present

## 2015-09-17 DIAGNOSIS — R82998 Other abnormal findings in urine: Secondary | ICD-10-CM

## 2015-09-17 DIAGNOSIS — N390 Urinary tract infection, site not specified: Secondary | ICD-10-CM

## 2015-09-17 DIAGNOSIS — Z0000 Encounter for general adult medical examination without abnormal findings: Secondary | ICD-10-CM

## 2015-09-17 DIAGNOSIS — Z1159 Encounter for screening for other viral diseases: Secondary | ICD-10-CM | POA: Diagnosis not present

## 2015-09-17 LAB — POCT URINALYSIS DIPSTICK
BILIRUBIN UA: NEGATIVE
GLUCOSE UA: NEGATIVE
Ketones, UA: NEGATIVE
Nitrite, UA: NEGATIVE
Protein, UA: NEGATIVE
RBC UA: NEGATIVE
Urobilinogen, UA: 2
pH, UA: 5.5

## 2015-09-17 LAB — HIV ANTIBODY (ROUTINE TESTING W REFLEX): HIV: NONREACTIVE

## 2015-09-17 LAB — HEPATITIS C ANTIBODY: HCV Ab: NEGATIVE

## 2015-09-17 NOTE — Progress Notes (Signed)
Subjective:     Elizabeth [name] is a 60 y.o. female and is here for a comprehensive physical exam. The patient reports no problems.  Social History   Social History  . Marital Status: Single    Spouse Name: N/A  . Number of Children: 1  . Years of Education: 14   Occupational History  . 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 Smoking status: Never Smoker   . Smokeless tobacco: Never Used  . Alcohol Use: No  . Drug Use: No  . Sexual Activity: No   Other Topics Concern  . Not on file   Social History Narrative   Exercise--- no   Health Maintenance  Topic Date Due  . Hepatitis C Screening  1956/04/23  . HIV Screening  01/13/2016 (Originally 10/22/1970)  . INFLUENZA VACCINE  01/12/2016  . PAP SMEAR  06/04/2016  . MAMMOGRAM  01/18/2017  . COLONOSCOPY  05/21/2023  . TETANUS/TDAP  09/16/2025    The following portions of the patient's history were reviewed and updated as appropriate:  She  has a past medical history of Acne and Eczema. She  does not have any pertinent problems on file. She  has past surgical history that includes Tubal ligation. Her family history includes Aneurysm in her maternal grandfather; Breast cancer in her maternal aunt and sister; Cancer (age of onset: 46) in her sister; Heart disease (age of onset: 36) in her father; Hepatitis (age of onset: 58) in her mother. She  reports that she has never smoked. She has never used smokeless tobacco. She reports that she does not drink alcohol or use illicit drugs. She has a current medication list which includes the following prescription(s): fluticasone. Current Outpatient Prescriptions on File Prior to Visit  Medication Sig Dispense Refill  . fluticasone (FLONASE) 50 MCG/ACT nasal spray Place 2 sprays into both nostrils daily. 16 g 1   No current facility-administered medications on file prior to visit.   She is allergic to 2-(ethylmercuriothio)benzoic acid; hydroxyethyl methacrylate;  and other..  Review of Systems Review of Systems  Constitutional: Negative for activity change, appetite change and fatigue.  HENT: Negative for hearing loss, congestion, tinnitus and ear discharge.  dentist q30m Eyes: Negative for visual disturbance (see optho q1y -- vision corrected to 20/20 with glasses).  Respiratory: Negative for cough, chest tightness and shortness of breath.   Cardiovascular: Negative for chest pain, palpitations and leg swelling.  Gastrointestinal: Negative for abdominal pain, diarrhea, constipation and abdominal distention.  Genitourinary: Negative for urgency, frequency, decreased urine volume and difficulty urinating.  Musculoskeletal: Negative for back pain, arthralgias and gait problem.  Skin: Negative for color change, pallor and rash.  Neurological: Negative for dizziness, light-headedness, numbness and headaches.  Hematological: Negative for adenopathy. Does not bruise/bleed easily.  Psychiatric/Behavioral: Negative for suicidal ideas, confusion, sleep disturbance, self-injury, dysphoric mood, decreased concentration and agitation.       Objective:    BP 108/68 mmHg  Pulse 61  Temp(Src) 98.2 F (36.8 C) (Oral)  Ht 5\' 1"  (1.549 m)  Wt 172 lb 9.6 oz (78.291 kg)  BMI 32.63 kg/m2  SpO2 97% General appearance: alert, cooperative, appears stated age and no distress Head: Normocephalic, without obvious abnormality, atraumatic Eyes: conjunctivae/corneas clear. PERRL, EOM's intact. Fundi benign. Ears: normal TM's and external ear canals both ears Nose: Nares normal. Septum midline. Mucosa normal. No drainage or sinus tenderness. Throat: lips, mucosa, and tongue normal; teeth and gums normal Neck: no adenopathy, no carotid bruit,  no JVD, supple, symmetrical, trachea midline and thyroid not enlarged, symmetric, no tenderness/mass/nodules Back: symmetric, no curvature. ROM normal. No CVA tenderness. Lungs: clear to auscultation bilaterally Breasts: normal  appearance, no masses or tenderness Heart: regular rate and rhythm, S1, S2 normal, no murmur, click, rub or gallop Abdomen: soft, non-tender; bowel sounds normal; no masses,  no organomegaly Pelvic: deferred Extremities: extremities normal, atraumatic, no cyanosis or edema Pulses: 2+ and symmetric Skin: Skin color, texture, turgor normal. No rashes or lesions Lymph nodes: Cervical, supraclavicular, and axillary nodes normal. Neurologic: Alert and oriented X 3, normal strength and tone. Normal symmetric reflexes. Normal coordination and gait Psych=-- no depression, no anxiety      Assessment:    Healthy female exam.      Plan:    ghm utd Check labs See After Visit Summary for Counseling Recommendations    1. Need for diphtheria-tetanus-pertussis (Tdap) vaccine, adult/adolescent  - Tdap vaccine greater than or equal to 7yo IM  2. Preventative health care ghm utd - TSH - POCT urinalysis dipstick - Lipid panel - HIV antibody - CBC with Differential/Platelet - Hepatitis C antibody  3. Need for hepatitis C screening test   - Hepatitis C antibody  4. HIV disease ([place])   - HIV antibody  5. Leukocytes in urine   - Urine Culture

## 2015-09-17 NOTE — Patient Instructions (Signed)
Preventive Care for Adults, Female A healthy lifestyle and preventive care can promote health and wellness. Preventive health guidelines for women include the following key practices.  A routine yearly physical is a good way to check with your health care provider about your health and preventive screening. It is a chance to share any concerns and updates on your health and to receive a thorough exam.  Visit your dentist for a routine exam and preventive care every 6 months. Brush your teeth twice a day and floss once a day. Good oral hygiene prevents tooth decay and gum disease.  The frequency of eye exams is based on your age, health, family medical history, use of contact lenses, and other factors. Follow your health care provider's recommendations for frequency of eye exams.  Eat a healthy diet. Foods like vegetables, fruits, whole grains, low-fat dairy products, and lean protein foods contain the nutrients you need without too many calories. Decrease your intake of foods high in solid fats, added sugars, and salt. Eat the right amount of calories for you.Get information about a proper diet from your health care provider, if necessary.  Regular physical exercise is one of the most important things you can do for your health. Most adults should get at least 150 minutes of moderate-intensity exercise (any activity that increases your heart rate and causes you to sweat) each week. In addition, most adults need muscle-strengthening exercises on 2 or more days a week.  Maintain a healthy weight. The body mass index (BMI) is a screening tool to identify possible weight problems. It provides an estimate of body fat based on height and weight. Your health care provider can find your BMI and can help you achieve or maintain a healthy weight.For adults 20 years and older:  A BMI below 18.5 is considered underweight.  A BMI of 18.5 to 24.9 is normal.  A BMI of 25 to 29.9 is considered overweight.  A  BMI of 30 and above is considered obese.  Maintain normal blood lipids and cholesterol levels by exercising and minimizing your intake of saturated fat. Eat a balanced diet with plenty of fruit and vegetables. Blood tests for lipids and cholesterol should begin at age 45 and be repeated every 5 years. If your lipid or cholesterol levels are high, you are over 50, or you are at high risk for heart disease, you may need your cholesterol levels checked more frequently.Ongoing high lipid and cholesterol levels should be treated with medicines if diet and exercise are not working.  If you smoke, find out from your health care provider how to quit. If you do not use tobacco, do not start.  Lung cancer screening is recommended for adults aged 45-80 years who are at high risk for developing lung cancer because of a history of smoking. A yearly low-dose CT scan of the lungs is recommended for people who have at least a 30-pack-year history of smoking and are a current smoker or have quit within the past 15 years. A pack year of smoking is smoking an average of 1 pack of cigarettes a day for 1 year (for example: 1 pack a day for 30 years or 2 packs a day for 15 years). Yearly screening should continue until the smoker has stopped smoking for at least 15 years. Yearly screening should be stopped for people who develop a health problem that would prevent them from having lung cancer treatment.  If you are pregnant, do not drink alcohol. If you are  breastfeeding, be very cautious about drinking alcohol. If you are not pregnant and choose to drink alcohol, do not have more than 1 drink per day. One drink is considered to be 12 ounces (355 mL) of beer, 5 ounces (148 mL) of wine, or 1.5 ounces (44 mL) of liquor.  Avoid use of street drugs. Do not share needles with anyone. Ask for help if you need support or instructions about stopping the use of drugs.  High blood pressure causes heart disease and increases the risk  of stroke. Your blood pressure should be checked at least every 1 to 2 years. Ongoing high blood pressure should be treated with medicines if weight loss and exercise do not work.  If you are 32-40 years old, ask your health care provider if you should take aspirin to prevent strokes.  Diabetes screening is done by taking a blood sample to check your blood glucose level after you have not eaten for a certain period of time (fasting). If you are not overweight and you do not have risk factors for diabetes, you should be screened once every 3 years starting at age 37. If you are overweight or obese and you are 22-72 years of age, you should be screened for diabetes every year as part of your cardiovascular risk assessment.  Breast cancer screening is essential preventive care for women. You should practice "breast self-awareness." This means understanding the normal appearance and feel of your breasts and may include breast self-examination. Any changes detected, no matter how small, should be reported to a health care provider. Women in their 85s and 30s should have a clinical breast exam (CBE) by a health care provider as part of a regular health exam every 1 to 3 years. After age 38, women should have a CBE every year. Starting at age 32, women should consider having a mammogram (breast X-ray test) every year. Women who have a family history of breast cancer should talk to their health care provider about genetic screening. Women at a high risk of breast cancer should talk to their health care providers about having an MRI and a mammogram every year.  Breast cancer gene (BRCA)-related cancer risk assessment is recommended for women who have family members with BRCA-related cancers. BRCA-related cancers include breast, ovarian, tubal, and peritoneal cancers. Having family members with these cancers may be associated with an increased risk for harmful changes (mutations) in the breast cancer genes BRCA1 and  BRCA2. Results of the assessment will determine the need for genetic counseling and BRCA1 and BRCA2 testing.  Your health care provider may recommend that you be screened regularly for cancer of the pelvic organs (ovaries, uterus, and vagina). This screening involves a pelvic examination, including checking for microscopic changes to the surface of your cervix (Pap test). You may be encouraged to have this screening done every 3 years, beginning at age 65.  For women ages 42-65, health care providers may recommend pelvic exams and Pap testing every 3 years, or they may recommend the Pap and pelvic exam, combined with testing for human papilloma virus (HPV), every 5 years. Some types of HPV increase your risk of cervical cancer. Testing for HPV may also be done on women of any age with unclear Pap test results.  Other health care providers may not recommend any screening for nonpregnant women who are considered low risk for pelvic cancer and who do not have symptoms. Ask your health care provider if a screening pelvic exam is right for  you.  If you have had past treatment for cervical cancer or a condition that could lead to cancer, you need Pap tests and screening for cancer for at least 20 years after your treatment. If Pap tests have been discontinued, your risk factors (such as having a new sexual partner) need to be reassessed to determine if screening should resume. Some women have medical problems that increase the chance of getting cervical cancer. In these cases, your health care provider may recommend more frequent screening and Pap tests.  Colorectal cancer can be detected and often prevented. Most routine colorectal cancer screening begins at the age of 24 years and continues through age 58 years. However, your health care provider may recommend screening at an earlier age if you have risk factors for colon cancer. On a yearly basis, your health care provider may provide home test kits to check  for hidden blood in the stool. Use of a small camera at the end of a tube, to directly examine the colon (sigmoidoscopy or colonoscopy), can detect the earliest forms of colorectal cancer. Talk to your health care provider about this at age 41, when routine screening begins. Direct exam of the colon should be repeated every 5-10 years through age 38 years, unless early forms of precancerous polyps or small growths are found.  People who are at an increased risk for hepatitis B should be screened for this virus. You are considered at high risk for hepatitis B if:  You were born in a country where hepatitis B occurs often. Talk with your health care provider about which countries are considered high risk.  Your parents were born in a high-risk country and you have not received a shot to protect against hepatitis B (hepatitis B vaccine).  You have HIV or AIDS.  You use needles to inject street drugs.  You live with, or have sex with, someone who has hepatitis B.  You get hemodialysis treatment.  You take certain medicines for conditions like cancer, organ transplantation, and autoimmune conditions.  Hepatitis C blood testing is recommended for all people born from [birth year] through 1965 and any individual with known risks for hepatitis C.  Practice safe sex. Use condoms and avoid high-risk sexual practices to reduce the spread of sexually transmitted infections (STIs). STIs include gonorrhea, chlamydia, syphilis, trichomonas, herpes, HPV, and human immunodeficiency virus (HIV). Herpes, HIV, and HPV are viral illnesses that have no cure. They can result in disability, cancer, and death.  You should be screened for sexually transmitted illnesses (STIs) including gonorrhea and chlamydia if:  You are sexually active and are younger than 24 years.  You are older than 24 years and your health care provider tells you that you are at risk for this type of infection.  Your sexual activity has changed  since you were last screened and you are at an increased risk for chlamydia or gonorrhea. Ask your health care provider if you are at risk.  If you are at risk of being infected with HIV, it is recommended that you take a prescription medicine daily to prevent HIV infection. This is called preexposure prophylaxis (PrEP). You are considered at risk if:  You are sexually active and do not regularly use condoms or know the HIV status of your partner(s).  You take drugs by injection.  You are sexually active with a partner who has HIV.  Talk with your health care provider about whether you are at high risk of being infected with HIV. If  you choose to begin PrEP, you should first be tested for HIV. You should then be tested every 3 months for as long as you are taking PrEP.  Osteoporosis is a disease in which the bones lose minerals and strength with aging. This can result in serious bone fractures or breaks. The risk of osteoporosis can be identified using a bone density scan. Women ages 67 years and over and women at risk for fractures or osteoporosis should discuss screening with their health care providers. Ask your health care provider whether you should take a calcium supplement or vitamin D to reduce the rate of osteoporosis.  Menopause can be associated with physical symptoms and risks. Hormone replacement therapy is available to decrease symptoms and risks. You should talk to your health care provider about whether hormone replacement therapy is right for you.  Use sunscreen. Apply sunscreen liberally and repeatedly throughout the day. You should seek shade when your shadow is shorter than you. Protect yourself by wearing long sleeves, pants, a wide-brimmed hat, and sunglasses year round, whenever you are outdoors.  Once a month, do a whole body skin exam, using a mirror to look at the skin on your back. Tell your health care provider of new moles, moles that have irregular borders, moles that  are larger than a pencil eraser, or moles that have changed in shape or color.  Stay current with required vaccines (immunizations).  Influenza vaccine. All adults should be immunized every year.  Tetanus, diphtheria, and acellular pertussis (Td, Tdap) vaccine. Pregnant women should receive 1 dose of Tdap vaccine during each pregnancy. The dose should be obtained regardless of the length of time since the last dose. Immunization is preferred during the 27th-36th week of gestation. An adult who has not previously received Tdap or who does not know her vaccine status should receive 1 dose of Tdap. This initial dose should be followed by tetanus and diphtheria toxoids (Td) booster doses every 10 years. Adults with an unknown or incomplete history of completing a 3-dose immunization series with Td-containing vaccines should begin or complete a primary immunization series including a Tdap dose. Adults should receive a Td booster every 10 years.  Varicella vaccine. An adult without evidence of immunity to varicella should receive 2 doses or a second dose if she has previously received 1 dose. Pregnant females who do not have evidence of immunity should receive the first dose after pregnancy. This first dose should be obtained before leaving the health care facility. The second dose should be obtained 4-8 weeks after the first dose.  Human papillomavirus (HPV) vaccine. Females aged 13-26 years who have not received the vaccine previously should obtain the 3-dose series. The vaccine is not recommended for use in pregnant females. However, pregnancy testing is not needed before receiving a dose. If a female is found to be pregnant after receiving a dose, no treatment is needed. In that case, the remaining doses should be delayed until after the pregnancy. Immunization is recommended for any person with an immunocompromised condition through the age of 61 years if she did not get any or all doses earlier. During the  3-dose series, the second dose should be obtained 4-8 weeks after the first dose. The third dose should be obtained 24 weeks after the first dose and 16 weeks after the second dose.  Zoster vaccine. One dose is recommended for adults aged 30 years or older unless certain conditions are present.  Measles, mumps, and rubella (MMR) vaccine. Adults born  before 1957 generally are considered immune to measles and mumps. Adults born in 1957 or later should have 1 or more doses of MMR vaccine unless there is a contraindication to the vaccine or there is laboratory evidence of immunity to each of the three diseases. A routine second dose of MMR vaccine should be obtained at least 28 days after the first dose for students attending postsecondary schools, health care workers, or international travelers. People who received inactivated measles vaccine or an unknown type of measles vaccine during 1963-1967 should receive 2 doses of MMR vaccine. People who received inactivated mumps vaccine or an unknown type of mumps vaccine before 1979 and are at high risk for mumps infection should consider immunization with 2 doses of MMR vaccine. For females of childbearing age, rubella immunity should be determined. If there is no evidence of immunity, females who are not pregnant should be vaccinated. If there is no evidence of immunity, females who are pregnant should delay immunization until after pregnancy. Unvaccinated health care workers born before 1957 who lack laboratory evidence of measles, mumps, or rubella immunity or laboratory confirmation of disease should consider measles and mumps immunization with 2 doses of MMR vaccine or rubella immunization with 1 dose of MMR vaccine.  Pneumococcal 13-valent conjugate (PCV13) vaccine. When indicated, a person who is uncertain of his immunization history and has no record of immunization should receive the PCV13 vaccine. All adults 65 years of age and older should receive this  vaccine. An adult aged 19 years or older who has certain medical conditions and has not been previously immunized should receive 1 dose of PCV13 vaccine. This PCV13 should be followed with a dose of pneumococcal polysaccharide (PPSV23) vaccine. Adults who are at high risk for pneumococcal disease should obtain the PPSV23 vaccine at least 8 weeks after the dose of PCV13 vaccine. Adults older than 60 years of age who have normal immune system function should obtain the PPSV23 vaccine dose at least 1 year after the dose of PCV13 vaccine.  Pneumococcal polysaccharide (PPSV23) vaccine. When PCV13 is also indicated, PCV13 should be obtained first. All adults aged 65 years and older should be immunized. An adult younger than age 65 years who has certain medical conditions should be immunized. Any person who resides in a nursing home or long-term care facility should be immunized. An adult smoker should be immunized. People with an immunocompromised condition and certain other conditions should receive both PCV13 and PPSV23 vaccines. People with human immunodeficiency virus (HIV) infection should be immunized as soon as possible after diagnosis. Immunization during chemotherapy or radiation therapy should be avoided. Routine use of PPSV23 vaccine is not recommended for American Indians, [place] Natives, or people younger than 65 years unless there are medical conditions that require PPSV23 vaccine. When indicated, people who have unknown immunization and have no record of immunization should receive PPSV23 vaccine. One-time revaccination 5 years after the first dose of PPSV23 is recommended for people aged 19-64 years who have chronic kidney failure, nephrotic syndrome, asplenia, or immunocompromised conditions. People who received 1-2 doses of PPSV23 before age 65 years should receive another dose of PPSV23 vaccine at age 65 years or later if at least 5 years have passed since the previous dose. Doses of PPSV23 are not  needed for people immunized with PPSV23 at or after age 65 years.  Meningococcal vaccine. Adults with asplenia or persistent complement component deficiencies should receive 2 doses of quadrivalent meningococcal conjugate (MenACWY-D) vaccine. The doses should be obtained   at least 2 months apart. Microbiologists working with certain meningococcal bacteria, [place] recruits, people at risk during an outbreak, and people who travel to or live in countries with a high rate of meningitis should be immunized. A first-year college student up through age 34 years who is living in a residence hall should receive a dose if she did not receive a dose on or after her 16th birthday. Adults who have certain high-risk conditions should receive one or more doses of vaccine.  Hepatitis A vaccine. Adults who wish to be protected from this disease, have certain high-risk conditions, work with hepatitis A-infected animals, work in hepatitis A research labs, or travel to or work in countries with a high rate of hepatitis A should be immunized. Adults who were previously unvaccinated and who anticipate close contact with an international adoptee during the first 60 days after arrival in the [place] from a country with a high rate of hepatitis A should be immunized.  Hepatitis B vaccine. Adults who wish to be protected from this disease, have certain high-risk conditions, may be exposed to blood or other infectious body fluids, are household contacts or sex partners of hepatitis B positive people, are clients or workers in certain care facilities, or travel to or work in countries with a high rate of hepatitis B should be immunized.  Haemophilus influenzae type b (Hib) vaccine. A previously unvaccinated person with asplenia or sickle cell disease or having a scheduled splenectomy should receive 1 dose of Hib vaccine. Regardless of previous immunization, a recipient of a hematopoietic stem cell transplant should receive a  3-dose series 6-12 months after her successful transplant. Hib vaccine is not recommended for adults with HIV infection. Preventive Services / Frequency Ages 35 to 4 years  Blood pressure check.** / Every 3-5 years.  Lipid and cholesterol check.** / Every 5 years beginning at age 60.  Clinical breast exam.** / Every 3 years for women in their 71s and 10s.  BRCA-related cancer risk assessment.** / For women who have family members with a BRCA-related cancer (breast, ovarian, tubal, or peritoneal cancers).  Pap test.** / Every 2 years from ages 76 through 26. Every 3 years starting at age 61 through age 76 or 93 with a history of 3 consecutive normal Pap tests.  HPV screening.** / Every 3 years from ages 37 through ages 60 to 51 with a history of 3 consecutive normal Pap tests.  Hepatitis C blood test.** / For any individual with known risks for hepatitis C.  Skin self-exam. / Monthly.  Influenza vaccine. / Every year.  Tetanus, diphtheria, and acellular pertussis (Tdap, Td) vaccine.** / Consult your health care provider. Pregnant women should receive 1 dose of Tdap vaccine during each pregnancy. 1 dose of Td every 10 years.  Varicella vaccine.** / Consult your health care provider. Pregnant females who do not have evidence of immunity should receive the first dose after pregnancy.  HPV vaccine. / 3 doses over 6 months, if 93 and younger. The vaccine is not recommended for use in pregnant females. However, pregnancy testing is not needed before receiving a dose.  Measles, mumps, rubella (MMR) vaccine.** / You need at least 1 dose of MMR if you were born in 1957 or later. You may also need a 2nd dose. For females of childbearing age, rubella immunity should be determined. If there is no evidence of immunity, females who are not pregnant should be vaccinated. If there is no evidence of immunity, females who are  pregnant should delay immunization until after pregnancy.  Pneumococcal  13-valent conjugate (PCV13) vaccine.** / Consult your health care provider.  Pneumococcal polysaccharide (PPSV23) vaccine.** / 1 to 2 doses if you smoke cigarettes or if you have certain conditions.  Meningococcal vaccine.** / 1 dose if you are age 88 to 77 years and a Market researcher living in a residence hall, or have one of several medical conditions, you need to get vaccinated against meningococcal disease. You may also need additional booster doses.  Hepatitis A vaccine.** / Consult your health care provider.  Hepatitis B vaccine.** / Consult your health care provider.  Haemophilus influenzae type b (Hib) vaccine.** / Consult your health care provider. Ages 32 to 46 years  Blood pressure check.** / Every year.  Lipid and cholesterol check.** / Every 5 years beginning at age 37 years.  Lung cancer screening. / Every year if you are aged 5-80 years and have a 30-pack-year history of smoking and currently smoke or have quit within the past 15 years. Yearly screening is stopped once you have quit smoking for at least 15 years or develop a health problem that would prevent you from having lung cancer treatment.  Clinical breast exam.** / Every year after age 6 years.  BRCA-related cancer risk assessment.** / For women who have family members with a BRCA-related cancer (breast, ovarian, tubal, or peritoneal cancers).  Mammogram.** / Every year beginning at age 17 years and continuing for as long as you are in good health. Consult with your health care provider.  Pap test.** / Every 3 years starting at age 66 years through age 61 or 33 years with a history of 3 consecutive normal Pap tests.  HPV screening.** / Every 3 years from ages 55 years through ages 31 to 12 years with a history of 3 consecutive normal Pap tests.  Fecal occult blood test (FOBT) of stool. / Every year beginning at age 74 years and continuing until age 51 years. You may not need to do this test if you get  a colonoscopy every 10 years.  Flexible sigmoidoscopy or colonoscopy.** / Every 5 years for a flexible sigmoidoscopy or every 10 years for a colonoscopy beginning at age 13 years and continuing until age 58 years.  Hepatitis C blood test.** / For all people born from [birth year] through 1965 and any individual with known risks for hepatitis C.  Skin self-exam. / Monthly.  Influenza vaccine. / Every year.  Tetanus, diphtheria, and acellular pertussis (Tdap/Td) vaccine.** / Consult your health care provider. Pregnant women should receive 1 dose of Tdap vaccine during each pregnancy. 1 dose of Td every 10 years.  Varicella vaccine.** / Consult your health care provider. Pregnant females who do not have evidence of immunity should receive the first dose after pregnancy.  Zoster vaccine.** / 1 dose for adults aged 67 years or older.  Measles, mumps, rubella (MMR) vaccine.** / You need at least 1 dose of MMR if you were born in 1957 or later. You may also need a second dose. For females of childbearing age, rubella immunity should be determined. If there is no evidence of immunity, females who are not pregnant should be vaccinated. If there is no evidence of immunity, females who are pregnant should delay immunization until after pregnancy.  Pneumococcal 13-valent conjugate (PCV13) vaccine.** / Consult your health care provider.  Pneumococcal polysaccharide (PPSV23) vaccine.** / 1 to 2 doses if you smoke cigarettes or if you have certain conditions.  Meningococcal vaccine.** /  Consult your health care provider.  Hepatitis A vaccine.** / Consult your health care provider.  Hepatitis B vaccine.** / Consult your health care provider.  Haemophilus influenzae type b (Hib) vaccine.** / Consult your health care provider. Ages 27 years and over  Blood pressure check.** / Every year.  Lipid and cholesterol check.** / Every 5 years beginning at age 31 years.  Lung cancer screening. / Every year if you  are aged 8-80 years and have a 30-pack-year history of smoking and currently smoke or have quit within the past 15 years. Yearly screening is stopped once you have quit smoking for at least 15 years or develop a health problem that would prevent you from having lung cancer treatment.  Clinical breast exam.** / Every year after age 63 years.  BRCA-related cancer risk assessment.** / For women who have family members with a BRCA-related cancer (breast, ovarian, tubal, or peritoneal cancers).  Mammogram.** / Every year beginning at age 71 years and continuing for as long as you are in good health. Consult with your health care provider.  Pap test.** / Every 3 years starting at age 56 years through age 16 or 12 years with 3 consecutive normal Pap tests. Testing can be stopped between 65 and 70 years with 3 consecutive normal Pap tests and no abnormal Pap or HPV tests in the past 10 years.  HPV screening.** / Every 3 years from ages 35 years through ages 71 or 18 years with a history of 3 consecutive normal Pap tests. Testing can be stopped between 65 and 70 years with 3 consecutive normal Pap tests and no abnormal Pap or HPV tests in the past 10 years.  Fecal occult blood test (FOBT) of stool. / Every year beginning at age 23 years and continuing until age 41 years. You may not need to do this test if you get a colonoscopy every 10 years.  Flexible sigmoidoscopy or colonoscopy.** / Every 5 years for a flexible sigmoidoscopy or every 10 years for a colonoscopy beginning at age 52 years and continuing until age 66 years.  Hepatitis C blood test.** / For all people born from [birth year] through 1965 and any individual with known risks for hepatitis C.  Osteoporosis screening.** / A one-time screening for women ages 23 years and over and women at risk for fractures or osteoporosis.  Skin self-exam. / Monthly.  Influenza vaccine. / Every year.  Tetanus, diphtheria, and acellular pertussis (Tdap/Td)  vaccine.** / 1 dose of Td every 10 years.  Varicella vaccine.** / Consult your health care provider.  Zoster vaccine.** / 1 dose for adults aged 39 years or older.  Pneumococcal 13-valent conjugate (PCV13) vaccine.** / Consult your health care provider.  Pneumococcal polysaccharide (PPSV23) vaccine.** / 1 dose for all adults aged 23 years and older.  Meningococcal vaccine.** / Consult your health care provider.  Hepatitis A vaccine.** / Consult your health care provider.  Hepatitis B vaccine.** / Consult your health care provider.  Haemophilus influenzae type b (Hib) vaccine.** / Consult your health care provider. ** Family history and personal history of risk and conditions may change your health care provider's recommendations.   This information is not intended to replace advice given to you by your health care provider. Make sure you discuss any questions you have with your health care provider.   Document Released: 07/26/2001 Document Revised: 06/20/2014 Document Reviewed: 10/25/2010 Elsevier Interactive Patient Education [employer].

## 2015-09-17 NOTE — Progress Notes (Signed)
Pre visit review using our clinic review tool, if applicable. No additional management support is needed unless otherwise documented below in the visit note. 

## 2015-09-18 LAB — CBC WITH DIFFERENTIAL/PLATELET
BASOS ABS: 0 10*3/uL (ref 0.0–0.1)
Basophils Relative: 0.5 % (ref 0.0–3.0)
EOS ABS: 0.1 10*3/uL (ref 0.0–0.7)
Eosinophils Relative: 1.5 % (ref 0.0–5.0)
HEMATOCRIT: 40.4 % (ref 36.0–46.0)
Hemoglobin: 13.8 g/dL (ref 12.0–15.0)
LYMPHS ABS: 2.4 10*3/uL (ref 0.7–4.0)
LYMPHS PCT: 36.3 % (ref 12.0–46.0)
MCHC: 34 g/dL (ref 30.0–36.0)
MCV: 89.4 fl (ref 78.0–100.0)
MONOS PCT: 9.9 % (ref 3.0–12.0)
Monocytes Absolute: 0.7 10*3/uL (ref 0.1–1.0)
NEUTROS PCT: 51.8 % (ref 43.0–77.0)
Neutro Abs: 3.4 10*3/uL (ref 1.4–7.7)
PLATELETS: 213 10*3/uL (ref 150.0–400.0)
RBC: 4.52 Mil/uL (ref 3.87–5.11)
RDW: 12.9 % (ref 11.5–15.5)
WBC: 6.6 10*3/uL (ref 4.0–10.5)

## 2015-09-18 LAB — LIPID PANEL
CHOL/HDL RATIO: 4
Cholesterol: 196 mg/dL (ref 0–200)
HDL: 49.7 mg/dL (ref >39.00)
LDL CALC: 123 mg/dL — AB (ref 0–99)
NONHDL: 146.12
Triglycerides: 115 mg/dL (ref 0.0–149.0)
VLDL: 23 mg/dL (ref 0.0–40.0)

## 2015-09-18 LAB — URINE CULTURE: Colony Count: 1000

## 2015-09-18 LAB — TSH: TSH: 0.79 u[IU]/mL (ref 0.35–4.50)

## 2015-09-22 ENCOUNTER — Telehealth: Payer: Self-pay

## 2015-09-22 NOTE — Telephone Encounter (Signed)
Spoke with patient regarding lab results. 

## 2015-10-09 ENCOUNTER — Telehealth: Payer: Self-pay | Admitting: Family Medicine

## 2015-10-09 NOTE — Telephone Encounter (Signed)
Filled out as much as possible and forwarded to Dr. Carollee Herter. JG//CMA

## 2015-10-09 NOTE — Telephone Encounter (Signed)
Pt dropped off a health form for Dr. Etter Sjogren to fill out and fax back, form was placed in [place] at front office

## 2015-10-12 NOTE — Telephone Encounter (Signed)
Form faxed to [facility] successfully at [phone]. Sent for scanning. JG//CMA

## 2016-02-08 LAB — HM MAMMOGRAPHY

## 2016-03-11 ENCOUNTER — Encounter: Payer: Self-pay | Admitting: Family Medicine

## 2016-05-20 ENCOUNTER — Other Ambulatory Visit: Payer: Self-pay | Admitting: Family Medicine

## 2016-05-20 NOTE — Telephone Encounter (Signed)
Relation to [id number] Call back [place]: [place], [place] [phone] (Phone) [phone] (Fax)    Reason for call:  Patient requesting a refill desonide (DESOWEN) 0.05 % lotion

## 2016-05-25 MED ORDER — DESONIDE 0.05 % EX LOTN: TOPICAL_LOTION | Freq: Two times a day (BID) | CUTANEOUS | 0 refills | Status: DC | PRN

## 2016-05-25 NOTE — Telephone Encounter (Signed)
Refill sent.

## 2016-05-25 NOTE — Telephone Encounter (Signed)
Pt is requesting to have Rx refilled but I do not see Desowen Lotion on pt's medication list. Please advise. LB

## 2016-09-19 ENCOUNTER — Telehealth: Payer: Self-pay | Admitting: Family Medicine

## 2016-09-19 MED ORDER — TRIAMCINOLONE ACETONIDE 0.1 % EX CREA: 1.0000 "application " | TOPICAL_CREAM | Freq: Two times a day (BID) | CUTANEOUS | 1 refills | Status: DC

## 2016-09-19 NOTE — Telephone Encounter (Signed)
Sent in prescription 

## 2016-09-19 NOTE — Telephone Encounter (Signed)
Triamcinolone cream bid 60 g  2 refills

## 2016-09-19 NOTE — Telephone Encounter (Signed)
Caller name:[facility] Relationship to patient: Can be reached:[phone] Pharmacy:  Reason for call:insurance will not cover desowen, requesting something else be called in

## 2016-09-22 DIAGNOSIS — L219 Seborrheic dermatitis, unspecified: Secondary | ICD-10-CM | POA: Diagnosis not present

## 2016-09-22 DIAGNOSIS — L719 Rosacea, unspecified: Secondary | ICD-10-CM | POA: Diagnosis not present

## 2016-12-05 ENCOUNTER — Ambulatory Visit (INDEPENDENT_AMBULATORY_CARE_PROVIDER_SITE_OTHER): Payer: 59 | Admitting: Family Medicine

## 2016-12-05 ENCOUNTER — Encounter: Payer: 59 | Admitting: Family Medicine

## 2016-12-05 ENCOUNTER — Encounter: Payer: Self-pay | Admitting: Family Medicine

## 2016-12-05 VITALS — BP 100/68 | HR 65 | Temp 98.2°F | Resp 16 | Ht 60.0 in | Wt 173.8 lb

## 2016-12-05 DIAGNOSIS — Z0000 Encounter for general adult medical examination without abnormal findings: Secondary | ICD-10-CM | POA: Diagnosis not present

## 2016-12-05 LAB — COMPREHENSIVE METABOLIC PANEL
ALBUMIN: 4.3 g/dL (ref 3.5–5.2)
ALT: 23 U/L (ref 0–35)
AST: 24 U/L (ref 0–37)
Alkaline Phosphatase: 66 U/L (ref 39–117)
BUN: 16 mg/dL (ref 6–23)
CALCIUM: 10.1 mg/dL (ref 8.4–10.5)
CHLORIDE: 104 meq/L (ref 96–112)
CO2: 30 meq/L (ref 19–32)
CREATININE: 0.95 mg/dL (ref 0.40–1.20)
GFR: 76.88 mL/min (ref >60.00)
Glucose, Bld: 93 mg/dL (ref 70–99)
POTASSIUM: 4.1 meq/L (ref 3.5–5.1)
Sodium: 138 mEq/L (ref 135–145)
Total Bilirubin: 1.1 mg/dL (ref 0.2–1.2)
Total Protein: 7.4 g/dL (ref 6.0–8.3)

## 2016-12-05 LAB — POC URINALSYSI DIPSTICK (AUTOMATED)
Bilirubin, UA: NEGATIVE
GLUCOSE UA: NEGATIVE
Ketones, UA: NEGATIVE
Leukocytes, UA: NEGATIVE
NITRITE UA: NEGATIVE
Protein, UA: NEGATIVE
RBC UA: NEGATIVE
Spec Grav, UA: 1.015 (ref 1.010–1.025)
UROBILINOGEN UA: 0.2 U/dL
pH, UA: 6 (ref 5.0–8.0)

## 2016-12-05 LAB — LIPID PANEL
CHOL/HDL RATIO: 4
CHOLESTEROL: 202 mg/dL — AB (ref 0–200)
HDL: 49.5 mg/dL (ref >39.00)
LDL CALC: 125 mg/dL — AB (ref 0–99)
NonHDL: 152.25
TRIGLYCERIDES: 136 mg/dL (ref 0.0–149.0)
VLDL: 27.2 mg/dL (ref 0.0–40.0)

## 2016-12-05 LAB — CBC WITH DIFFERENTIAL/PLATELET
BASOS PCT: 0.8 % (ref 0.0–3.0)
Basophils Absolute: 0 10*3/uL (ref 0.0–0.1)
EOS ABS: 0.2 10*3/uL (ref 0.0–0.7)
EOS PCT: 2.7 % (ref 0.0–5.0)
HEMATOCRIT: 41.1 % (ref 36.0–46.0)
HEMOGLOBIN: 13.9 g/dL (ref 12.0–15.0)
LYMPHS PCT: 40.5 % (ref 12.0–46.0)
Lymphs Abs: 2.3 10*3/uL (ref 0.7–4.0)
MCHC: 33.7 g/dL (ref 30.0–36.0)
MCV: 90.9 fl (ref 78.0–100.0)
MONOS PCT: 9.7 % (ref 3.0–12.0)
Monocytes Absolute: 0.5 10*3/uL (ref 0.1–1.0)
NEUTROS ABS: 2.6 10*3/uL (ref 1.4–7.7)
Neutrophils Relative %: 46.3 % (ref 43.0–77.0)
PLATELETS: 212 10*3/uL (ref 150.0–400.0)
RBC: 4.52 Mil/uL (ref 3.87–5.11)
RDW: 12.7 % (ref 11.5–15.5)
WBC: 5.7 10*3/uL (ref 4.0–10.5)

## 2016-12-05 LAB — TSH: TSH: 0.86 u[IU]/mL (ref 0.35–4.50)

## 2016-12-05 NOTE — Patient Instructions (Signed)
Preventive Care 40-64 Years, Female Preventive care refers to lifestyle choices and visits with your health care provider that can promote health and wellness. What does preventive care include?  A yearly physical exam. This is also called an annual well check.  Dental exams once or twice a year.  Routine eye exams. Ask your health care provider how often you should have your eyes checked.  Personal lifestyle choices, including: ? Daily care of your teeth and gums. ? Regular physical activity. ? Eating a healthy diet. ? Avoiding tobacco and drug use. ? Limiting alcohol use. ? Practicing safe sex. ? Taking low-dose aspirin daily starting at age 58. ? Taking vitamin and mineral supplements as recommended by your health care provider. What happens during an annual well check? The services and screenings done by your health care provider during your annual well check will depend on your age, overall health, lifestyle risk factors, and family history of disease. Counseling Your health care provider may ask you questions about your:  Alcohol use.  Tobacco use.  Drug use.  Emotional well-being.  Home and relationship well-being.  Sexual activity.  Eating habits.  Work and work Statistician.  Method of birth control.  Menstrual cycle.  Pregnancy history.  Screening You may have the following tests or measurements:  Height, weight, and BMI.  Blood pressure.  Lipid and cholesterol levels. These may be checked every 5 years, or more frequently if you are over 81 years old.  Skin check.  Lung cancer screening. You may have this screening every year starting at age 78 if you have a 30-pack-year history of smoking and currently smoke or have quit within the past 15 years.  Fecal occult blood test (FOBT) of the stool. You may have this test every year starting at age 65.  Flexible sigmoidoscopy or colonoscopy. You may have a sigmoidoscopy every 5 years or a colonoscopy  every 10 years starting at age 30.  Hepatitis C blood test.  Hepatitis B blood test.  Sexually transmitted disease (STD) testing.  Diabetes screening. This is done by checking your blood sugar (glucose) after you have not eaten for a while (fasting). You may have this done every 1-3 years.  Mammogram. This may be done every 1-2 years. Talk to your health care provider about when you should start having regular mammograms. This may depend on whether you have a family history of breast cancer.  BRCA-related cancer screening. This may be done if you have a family history of breast, ovarian, tubal, or peritoneal cancers.  Pelvic exam and Pap test. This may be done every 3 years starting at age 80. Starting at age 36, this may be done every 5 years if you have a Pap test in combination with an HPV test.  Bone density scan. This is done to screen for osteoporosis. You may have this scan if you are at high risk for osteoporosis.  Discuss your test results, treatment options, and if necessary, the need for more tests with your health care provider. Vaccines Your health care provider may recommend certain vaccines, such as:  Influenza vaccine. This is recommended every year.  Tetanus, diphtheria, and acellular pertussis (Tdap, Td) vaccine. You may need a Td booster every 10 years.  Varicella vaccine. You may need this if you have not been vaccinated.  Zoster vaccine. You may need this after age 5.  Measles, mumps, and rubella (MMR) vaccine. You may need at least one dose of MMR if you were born in  1957 or later. You may also need a second dose.  Pneumococcal 13-valent conjugate (PCV13) vaccine. You may need this if you have certain conditions and were not previously vaccinated.  Pneumococcal polysaccharide (PPSV23) vaccine. You may need one or two doses if you smoke cigarettes or if you have certain conditions.  Meningococcal vaccine. You may need this if you have certain  conditions.  Hepatitis A vaccine. You may need this if you have certain conditions or if you travel or work in places where you may be exposed to hepatitis A.  Hepatitis B vaccine. You may need this if you have certain conditions or if you travel or work in places where you may be exposed to hepatitis B.  Haemophilus influenzae type b (Hib) vaccine. You may need this if you have certain conditions.  Talk to your health care provider about which screenings and vaccines you need and how often you need them. This information is not intended to replace advice given to you by your health care provider. Make sure you discuss any questions you have with your health care provider. Document Released: 06/26/2015 Document Revised: 02/17/2016 Document Reviewed: 03/31/2015 Elsevier Interactive Patient Education  2017 [employer].

## 2016-12-05 NOTE — Progress Notes (Signed)
Subjective:   I acted as a Education administrator for Dr. Carollee Herter.  Guerry Bruin, CMA   Elizabeth [name] is a 61 y.o. female and is here for a comprehensive physical exam. The patient reports no problems.  Social History   Social History  . Marital status: Single    Spouse name: N/A  . Number of children: 1  . 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 Smoking status: Never Smoker  . Smokeless tobacco: Never Used  . Alcohol use No  . Drug use: No  . Sexual activity: No   Other Topics Concern  . Not on file   Social History Narrative   Exercise--- walk   Health Maintenance  Topic Date Due  . PAP SMEAR  06/04/2016  . INFLUENZA VACCINE  01/11/2017  . MAMMOGRAM  02/07/2017  . COLONOSCOPY  05/21/2023  . TETANUS/TDAP  09/16/2025  . Hepatitis C Screening  Completed  . HIV Screening  Completed    The following portions of the patient's history were reviewed and updated as appropriate:  She  has a past medical history of Acne and Eczema. She  does not have any pertinent problems on file. She  has a past surgical history that includes Tubal ligation. Her family history includes Aneurysm in her maternal grandfather; Breast cancer in her maternal aunt and sister; Cancer (age of onset: 61) in her sister; Heart disease (age of onset: 35) in her father; Hepatitis (age of onset: 44) in her mother. She  reports that she has never smoked. She has never used smokeless tobacco. She reports that she does not drink alcohol or use drugs. She currently has no medications in their medication list. No current outpatient prescriptions on file prior to visit.   No current facility-administered medications on file prior to visit.    She is allergic to 2-(ethylmercuriothio)benzoic acid; hydroxyethyl methacrylate; and other..  Review of Systems Review of Systems  Constitutional: Negative for activity change, appetite change and fatigue.   HENT: Negative for hearing loss, congestion, tinnitus and ear discharge.  dentist q72m Eyes: Negative for visual disturbance (see optho q1y -- vision corrected to 20/20 with glasses).  Respiratory: Negative for cough, chest tightness and shortness of breath.   Cardiovascular: Negative for chest pain, palpitations and leg swelling.  Gastrointestinal: Negative for abdominal pain, diarrhea, constipation and abdominal distention.  Genitourinary: Negative for urgency, frequency, decreased urine volume and difficulty urinating.  Musculoskeletal: Negative for back pain, arthralgias and gait problem.  Skin: Negative for color change, pallor and rash.  Neurological: Negative for dizziness, light-headedness, numbness and headaches.  Hematological: Negative for adenopathy. Does not bruise/bleed easily.  Psychiatric/Behavioral: Negative for suicidal ideas, confusion, sleep disturbance, self-injury, dysphoric mood, decreased concentration and agitation.       Objective:    BP 100/68 (BP Location: Left Arm, Cuff Size: Normal)   Pulse 65   Temp 98.2 F (36.8 C) (Oral)   Resp 16   Ht 5' (1.524 m)   Wt 173 lb 12.8 oz (78.8 kg)   SpO2 96%   BMI 33.94 kg/m  General appearance: alert, cooperative, appears stated age and no distress Head: Normocephalic, without obvious abnormality, atraumatic Eyes: conjunctivae/corneas clear. PERRL, EOM's intact. Fundi benign. Ears: normal TM's and external ear canals both ears Nose: Nares normal. Septum midline. Mucosa normal. No drainage or sinus tenderness. Throat: lips, mucosa, and tongue normal; teeth and gums normal Neck: no adenopathy, no carotid bruit,  no JVD, supple, symmetrical, trachea midline and thyroid not enlarged, symmetric, no tenderness/mass/nodules Back: symmetric, no curvature. ROM normal. No CVA tenderness. Lungs: clear to auscultation bilaterally Breasts: gyn Heart: regular rate and rhythm, S1, S2 normal, no murmur, click, rub or  gallop Abdomen: soft, non-tender; bowel sounds normal; no masses,  no organomegaly Pelvic: deferred-- gyn Extremities: extremities normal, atraumatic, no cyanosis or edema Pulses: 2+ and symmetric Skin: Skin color, texture, turgor normal. No rashes or lesions Lymph nodes: Cervical, supraclavicular, and axillary nodes normal. Neurologic: Alert and oriented X 3, normal strength and tone. Normal symmetric reflexes. Normal coordination and gait     Assessment:    Healthy female exam.     Plan:     See After Visit Summary for Counseling Recommendations   ghm utd Check labs  1. Preventative health care See above - POCT Urinalysis Dipstick (Automated) - CBC with Differential/Platelet - Comprehensive metabolic panel - Lipid panel - TSH

## 2016-12-12 ENCOUNTER — Other Ambulatory Visit: Payer: Self-pay | Admitting: *Deleted

## 2016-12-12 DIAGNOSIS — E785 Hyperlipidemia, unspecified: Secondary | ICD-10-CM

## 2017-03-17 ENCOUNTER — Telehealth: Payer: Self-pay | Admitting: *Deleted

## 2017-03-17 NOTE — Telephone Encounter (Signed)
Received Physician Orders from [facility]; forwarded to provider/SLS 10/05

## 2017-03-20 ENCOUNTER — Other Ambulatory Visit (INDEPENDENT_AMBULATORY_CARE_PROVIDER_SITE_OTHER): Payer: 59

## 2017-03-20 ENCOUNTER — Telehealth: Payer: Self-pay | Admitting: Family Medicine

## 2017-03-20 DIAGNOSIS — Z803 Family history of malignant neoplasm of breast: Secondary | ICD-10-CM | POA: Diagnosis not present

## 2017-03-20 DIAGNOSIS — E785 Hyperlipidemia, unspecified: Secondary | ICD-10-CM

## 2017-03-20 DIAGNOSIS — Z1231 Encounter for screening mammogram for malignant neoplasm of breast: Secondary | ICD-10-CM | POA: Diagnosis not present

## 2017-03-20 LAB — LIPID PANEL
CHOLESTEROL: 194 mg/dL (ref 0–200)
HDL: 51.9 mg/dL (ref >39.00)
LDL Cholesterol: 111 mg/dL — ABNORMAL HIGH (ref 0–99)
NonHDL: 142.48
Total CHOL/HDL Ratio: 4
Triglycerides: 156 mg/dL — ABNORMAL HIGH (ref 0.0–149.0)
VLDL: 31.2 mg/dL (ref 0.0–40.0)

## 2017-03-20 LAB — COMPREHENSIVE METABOLIC PANEL
ALBUMIN: 4.1 g/dL (ref 3.5–5.2)
ALK PHOS: 61 U/L (ref 39–117)
ALT: 20 U/L (ref 0–35)
AST: 21 U/L (ref 0–37)
BUN: 17 mg/dL (ref 6–23)
CHLORIDE: 107 meq/L (ref 96–112)
CO2: 23 mEq/L (ref 19–32)
Calcium: 9.8 mg/dL (ref 8.4–10.5)
Creatinine, Ser: 0.85 mg/dL (ref 0.40–1.20)
GFR: 87.33 mL/min (ref >60.00)
GLUCOSE: 100 mg/dL — AB (ref 70–99)
POTASSIUM: 4.1 meq/L (ref 3.5–5.1)
Sodium: 139 mEq/L (ref 135–145)
TOTAL PROTEIN: 7.4 g/dL (ref 6.0–8.3)
Total Bilirubin: 0.8 mg/dL (ref 0.2–1.2)

## 2017-03-20 NOTE — Telephone Encounter (Signed)
[name] Self [phone]  Elizabeth [name] came in for her labs and wants to make sure that she is on the list for [place]. Please call and advise patient

## 2017-03-27 NOTE — Telephone Encounter (Signed)
Labs completed. Patient informed currently we only have enough to give out to those needing the second dose. To continue to check back with [place] to check on when we get more in. Also did inform her to check with her pharmacy. As well.

## 2017-03-28 DIAGNOSIS — Z01419 Encounter for gynecological examination (general) (routine) without abnormal findings: Secondary | ICD-10-CM | POA: Diagnosis not present

## 2018-02-26 ENCOUNTER — Telehealth: Payer: Self-pay | Admitting: *Deleted

## 2018-02-26 NOTE — Telephone Encounter (Signed)
Received Physician Orders from [place]; forwarded to provider/SLS 09/16

## 2018-04-02 DIAGNOSIS — Z01419 Encounter for gynecological examination (general) (routine) without abnormal findings: Secondary | ICD-10-CM | POA: Diagnosis not present

## 2018-04-02 DIAGNOSIS — M8589 Other specified disorders of bone density and structure, multiple sites: Secondary | ICD-10-CM | POA: Diagnosis not present

## 2018-04-02 DIAGNOSIS — D259 Leiomyoma of uterus, unspecified: Secondary | ICD-10-CM | POA: Diagnosis not present

## 2018-04-02 DIAGNOSIS — Z6834 Body mass index (BMI) 34.0-34.9, adult: Secondary | ICD-10-CM | POA: Diagnosis not present

## 2018-04-02 DIAGNOSIS — Z1231 Encounter for screening mammogram for malignant neoplasm of breast: Secondary | ICD-10-CM | POA: Diagnosis not present

## 2018-04-02 LAB — HM MAMMOGRAPHY

## 2018-04-02 LAB — HM DEXA SCAN

## 2018-04-04 ENCOUNTER — Telehealth: Payer: Self-pay | Admitting: *Deleted

## 2018-04-04 NOTE — Telephone Encounter (Signed)
Received Bone Density results from [place]; forwarded to provider/SLS 10/23  Received Mammogram results from [place]; forwarded to provider/SLS 10/23

## 2018-04-06 ENCOUNTER — Telehealth: Payer: Self-pay | Admitting: *Deleted

## 2018-04-06 NOTE — Telephone Encounter (Signed)
Received Bone Density results from [place]; forwarded to provider/SLS 10/25

## 2018-04-10 ENCOUNTER — Encounter: Payer: Self-pay | Admitting: Family Medicine

## 2018-04-11 ENCOUNTER — Telehealth: Payer: Self-pay | Admitting: *Deleted

## 2018-04-11 ENCOUNTER — Encounter: Payer: Self-pay | Admitting: *Deleted

## 2018-04-11 DIAGNOSIS — M8580 Other specified disorders of bone density and structure, unspecified site: Secondary | ICD-10-CM

## 2018-04-11 NOTE — Telephone Encounter (Signed)
Left detailed message of bone density report  From Dr. Etter Sjogren Showed osteopenia Take calcium 1200mg  daily and vitamin d3 1000 units daily, and weight bearing exercises. We will recheck in 2 years.

## 2018-04-27 ENCOUNTER — Ambulatory Visit (INDEPENDENT_AMBULATORY_CARE_PROVIDER_SITE_OTHER): Payer: 59 | Admitting: Family Medicine

## 2018-04-27 ENCOUNTER — Encounter: Payer: Self-pay | Admitting: Family Medicine

## 2018-04-27 VITALS — BP 110/48 | HR 65 | Temp 97.9°F | Resp 16 | Ht 62.0 in | Wt 180.8 lb

## 2018-04-27 DIAGNOSIS — Z23 Encounter for immunization: Secondary | ICD-10-CM

## 2018-04-27 DIAGNOSIS — Z0000 Encounter for general adult medical examination without abnormal findings: Secondary | ICD-10-CM

## 2018-04-27 LAB — CBC WITH DIFFERENTIAL/PLATELET
BASOS PCT: 1 % (ref 0.0–3.0)
Basophils Absolute: 0.1 10*3/uL (ref 0.0–0.1)
EOS PCT: 2.4 % (ref 0.0–5.0)
Eosinophils Absolute: 0.1 10*3/uL (ref 0.0–0.7)
HCT: 42.3 % (ref 36.0–46.0)
HEMOGLOBIN: 14.1 g/dL (ref 12.0–15.0)
LYMPHS PCT: 39.8 % (ref 12.0–46.0)
Lymphs Abs: 2.3 10*3/uL (ref 0.7–4.0)
MCHC: 33.4 g/dL (ref 30.0–36.0)
MCV: 92.8 fl (ref 78.0–100.0)
MONOS PCT: 10.1 % (ref 3.0–12.0)
Monocytes Absolute: 0.6 10*3/uL (ref 0.1–1.0)
Neutro Abs: 2.7 10*3/uL (ref 1.4–7.7)
Neutrophils Relative %: 46.7 % (ref 43.0–77.0)
Platelets: 227 10*3/uL (ref 150.0–400.0)
RBC: 4.56 Mil/uL (ref 3.87–5.11)
RDW: 12.6 % (ref 11.5–15.5)
WBC: 5.8 10*3/uL (ref 4.0–10.5)

## 2018-04-27 LAB — COMPREHENSIVE METABOLIC PANEL
ALT: 24 U/L (ref 0–35)
AST: 23 U/L (ref 0–37)
Albumin: 4.4 g/dL (ref 3.5–5.2)
Alkaline Phosphatase: 67 U/L (ref 39–117)
BUN: 19 mg/dL (ref 6–23)
CHLORIDE: 104 meq/L (ref 96–112)
CO2: 30 meq/L (ref 19–32)
CREATININE: 0.89 mg/dL (ref 0.40–1.20)
Calcium: 10.1 mg/dL (ref 8.4–10.5)
GFR: 82.51 mL/min (ref >60.00)
Glucose, Bld: 91 mg/dL (ref 70–99)
Potassium: 4 mEq/L (ref 3.5–5.1)
SODIUM: 141 meq/L (ref 135–145)
Total Bilirubin: 1.1 mg/dL (ref 0.2–1.2)
Total Protein: 7.7 g/dL (ref 6.0–8.3)

## 2018-04-27 LAB — LIPID PANEL
Cholesterol: 208 mg/dL — ABNORMAL HIGH (ref 0–200)
HDL: 53.7 mg/dL (ref >39.00)
LDL CALC: 133 mg/dL — AB (ref 0–99)
NonHDL: 154.73
Total CHOL/HDL Ratio: 4
Triglycerides: 108 mg/dL (ref 0.0–149.0)
VLDL: 21.6 mg/dL (ref 0.0–40.0)

## 2018-04-27 LAB — TSH: TSH: 0.88 u[IU]/mL (ref 0.35–4.50)

## 2018-04-27 NOTE — Patient Instructions (Signed)
Preventive Care 40-64 Years, Female Preventive care refers to lifestyle choices and visits with your health care provider that can promote health and wellness. What does preventive care include?  A yearly physical exam. This is also called an annual well check.  Dental exams once or twice a year.  Routine eye exams. Ask your health care provider how often you should have your eyes checked.  Personal lifestyle choices, including: ? Daily care of your teeth and gums. ? Regular physical activity. ? Eating a healthy diet. ? Avoiding tobacco and drug use. ? Limiting alcohol use. ? Practicing safe sex. ? Taking low-dose aspirin daily starting at age 58. ? Taking vitamin and mineral supplements as recommended by your health care provider. What happens during an annual well check? The services and screenings done by your health care provider during your annual well check will depend on your age, overall health, lifestyle risk factors, and family history of disease. Counseling Your health care provider may ask you questions about your:  Alcohol use.  Tobacco use.  Drug use.  Emotional well-being.  Home and relationship well-being.  Sexual activity.  Eating habits.  Work and work Statistician.  Method of birth control.  Menstrual cycle.  Pregnancy history.  Screening You may have the following tests or measurements:  Height, weight, and BMI.  Blood pressure.  Lipid and cholesterol levels. These may be checked every 5 years, or more frequently if you are over 81 years old.  Skin check.  Lung cancer screening. You may have this screening every year starting at age 78 if you have a 30-pack-year history of smoking and currently smoke or have quit within the past 15 years.  Fecal occult blood test (FOBT) of the stool. You may have this test every year starting at age 65.  Flexible sigmoidoscopy or colonoscopy. You may have a sigmoidoscopy every 5 years or a colonoscopy  every 10 years starting at age 30.  Hepatitis C blood test.  Hepatitis B blood test.  Sexually transmitted disease (STD) testing.  Diabetes screening. This is done by checking your blood sugar (glucose) after you have not eaten for a while (fasting). You may have this done every 1-3 years.  Mammogram. This may be done every 1-2 years. Talk to your health care provider about when you should start having regular mammograms. This may depend on whether you have a family history of breast cancer.  BRCA-related cancer screening. This may be done if you have a family history of breast, ovarian, tubal, or peritoneal cancers.  Pelvic exam and Pap test. This may be done every 3 years starting at age 80. Starting at age 36, this may be done every 5 years if you have a Pap test in combination with an HPV test.  Bone density scan. This is done to screen for osteoporosis. You may have this scan if you are at high risk for osteoporosis.  Discuss your test results, treatment options, and if necessary, the need for more tests with your health care provider. Vaccines Your health care provider may recommend certain vaccines, such as:  Influenza vaccine. This is recommended every year.  Tetanus, diphtheria, and acellular pertussis (Tdap, Td) vaccine. You may need a Td booster every 10 years.  Varicella vaccine. You may need this if you have not been vaccinated.  Zoster vaccine. You may need this after age 5.  Measles, mumps, and rubella (MMR) vaccine. You may need at least one dose of MMR if you were born in  1957 or later. You may also need a second dose.  Pneumococcal 13-valent conjugate (PCV13) vaccine. You may need this if you have certain conditions and were not previously vaccinated.  Pneumococcal polysaccharide (PPSV23) vaccine. You may need one or two doses if you smoke cigarettes or if you have certain conditions.  Meningococcal vaccine. You may need this if you have certain  conditions.  Hepatitis A vaccine. You may need this if you have certain conditions or if you travel or work in places where you may be exposed to hepatitis A.  Hepatitis B vaccine. You may need this if you have certain conditions or if you travel or work in places where you may be exposed to hepatitis B.  Haemophilus influenzae type b (Hib) vaccine. You may need this if you have certain conditions.  Talk to your health care provider about which screenings and vaccines you need and how often you need them. This information is not intended to replace advice given to you by your health care provider. Make sure you discuss any questions you have with your health care provider. Document Released: 06/26/2015 Document Revised: 02/17/2016 Document Reviewed: 03/31/2015 Elsevier Interactive Patient Education  2018 [employer]  

## 2018-04-27 NOTE — Progress Notes (Signed)
Subjective:     Elizabeth [name] is a 62 y.o. female and is here for a comprehensive physical exam. The patient reports no problems.  Social History   Socioeconomic History  . Marital status: Single    Spouse name: Not on file  . Number of children: 1  . Years of education: 7  . Highest education level: Not on file  Occupational History  . Occupation: Product/process development scientist: [employer]  . Financial resource strain: Not on file  . Food insecurity:    Worry: Not on file    Inability: Not on file  . Transportation needs:    Medical: Not on file    Non-medical: Not on file  Tobacco Use  . Smoking status: Never Smoker  . Smokeless tobacco: Never Used  Substance and Sexual Activity  . Alcohol use: No  . Drug use: No  . Sexual activity: Never    Partners: Male  Lifestyle  . Physical activity:    Days per week: Not on file    Minutes per session: Not on file  . Stress: Not on file  Relationships  . Social connections:    Talks on phone: Not on file    Gets together: Not on file    Attends religious service: Not on file    Active member of club or organization: Not on file    Attends meetings of clubs or organizations: Not on file    Relationship status: Not on file  . Intimate partner violence:    Fear of current or ex partner: Not on file    Emotionally abused: Not on file    Physically abused: Not on file    Forced sexual activity: Not on file  Other Topics Concern  . Not on file  Social History Narrative   Exercise--- walk   Health Maintenance  Topic Date Due  . PAP SMEAR  06/04/2016  . INFLUENZA VACCINE  01/12/2019 (Originally 01/11/2018)  . MAMMOGRAM  04/03/2019  . DEXA SCAN  04/02/2020  . COLONOSCOPY  05/21/2023  . TETANUS/TDAP  09/16/2025  . Hepatitis C Screening  Completed  . HIV Screening  Completed    The following portions of the patient's history were reviewed and updated as appropriate: She  has a past medical history of Acne  and Eczema. She does not have any pertinent problems on file. She  has a past surgical history that includes Tubal ligation. Her family history includes Aneurysm in her maternal grandfather; Breast cancer in her maternal aunt and sister; Cancer (age of onset: 75) in her sister; Heart disease (age of onset: 52) in her father; Hepatitis (age of onset: 72) in her mother. She  reports that she has never smoked. She has never used smokeless tobacco. She reports that she does not drink alcohol or use drugs. She currently has no medications in their medication list. No current outpatient medications on file prior to visit.   No current facility-administered medications on file prior to visit.    She is allergic to 2-(ethylmercuriothio)benzoic acid; hydroxyethyl methacrylate; and other..  Review of Systems Review of Systems  Constitutional: Negative for activity change, appetite change and fatigue.  HENT: Negative for hearing loss, congestion, tinnitus and ear discharge.  dentist q48m Eyes: Negative for visual disturbance (see optho q1y -- vision corrected to 20/20 with glasses).  Respiratory: Negative for cough, chest tightness and shortness of breath.   Cardiovascular: Negative for chest pain, palpitations and leg swelling.  Gastrointestinal: Negative for abdominal pain, diarrhea, constipation and abdominal distention.  Genitourinary: Negative for urgency, frequency, decreased urine volume and difficulty urinating. +vaginal d/c and odor  Musculoskeletal: Negative for back pain, arthralgias and gait problem.  Skin: Negative for color change, pallor and rash.  Neurological: Negative for dizziness, light-headedness, numbness and headaches.  Hematological: Negative for adenopathy. Does not bruise/bleed easily.  Psychiatric/Behavioral: Negative for suicidal ideas, confusion, sleep disturbance, self-injury, dysphoric mood, decreased concentration and agitation.       Objective:    BP (!) 110/48  (BP Location: Right Arm, Cuff Size: Large)   Pulse 65   Temp 97.9 F (36.6 C) (Oral)   Resp 16   Ht 5\' 2"  (1.575 m)   Wt 180 lb 12.8 oz (82 kg)   SpO2 99%   BMI 33.07 kg/m  General appearance: alert, cooperative, appears stated age and no distress Head: Normocephalic, without obvious abnormality, atraumatic Eyes: conjunctivae/corneas clear. PERRL, EOM's intact. Fundi benign. Ears: normal TM's and external ear canals both ears Nose: Nares normal. Septum midline. Mucosa normal. No drainage or sinus tenderness. Throat: lips, mucosa, and tongue normal; teeth and gums normal Neck: no adenopathy, no carotid bruit, no JVD, supple, symmetrical, trachea midline and thyroid not enlarged, symmetric, no tenderness/mass/nodules Back: symmetric, no curvature. ROM normal. No CVA tenderness. Lungs: clear to auscultation bilaterally Breasts: normal appearance, no masses or tenderness Heart: regular rate and rhythm, S1, S2 normal, no murmur, click, rub or gallop Abdomen: soft, non-tender; bowel sounds normal; no masses,  no organomegaly Pelvic: not indicated; post-menopausal, no abnormal Pap smears in past--pt preference  Extremities: extremities normal, atraumatic, no cyanosis or edema Pulses: 2+ and symmetric Skin: Skin color, texture, turgor normal. No rashes or lesions Lymph nodes: Cervical, supraclavicular, and axillary nodes normal. Neurologic: Alert and oriented X 3, normal strength and tone. Normal symmetric reflexes. Normal coordination and gait    Assessment:    Healthy female exam.      Plan:    ghm utd Check labs   See After Visit Summary for Counseling Recommendations   1. Preventative health care See above - CBC with Differential/Platelet - Lipid panel - Comprehensive metabolic panel - TSH  2. Need for shingles vaccine  - Varicella-zoster vaccine IM (Shingrix)

## 2018-04-28 ENCOUNTER — Encounter: Payer: Self-pay | Admitting: Family Medicine

## 2018-05-01 ENCOUNTER — Encounter: Payer: 59 | Admitting: Family Medicine

## 2018-05-01 ENCOUNTER — Encounter

## 2018-06-27 ENCOUNTER — Ambulatory Visit: Payer: 59

## 2018-07-03 ENCOUNTER — Ambulatory Visit: Payer: 59

## 2018-07-06 ENCOUNTER — Ambulatory Visit (INDEPENDENT_AMBULATORY_CARE_PROVIDER_SITE_OTHER): Payer: 59

## 2018-07-06 DIAGNOSIS — Z23 Encounter for immunization: Secondary | ICD-10-CM | POA: Diagnosis not present

## 2018-07-06 NOTE — Progress Notes (Signed)
Pre visit review using our clinic review tool, if applicable. No additional management support is needed unless otherwise documented below in the visit note.  Patient here today for second shingrix vaccine. 0.27mL of shingrix given IM in left deltoid. Pt tolerated well.

## 2018-11-19 ENCOUNTER — Ambulatory Visit (INDEPENDENT_AMBULATORY_CARE_PROVIDER_SITE_OTHER): Payer: 59 | Admitting: Family Medicine

## 2018-11-19 ENCOUNTER — Other Ambulatory Visit: Payer: Self-pay

## 2018-11-19 ENCOUNTER — Encounter: Payer: Self-pay | Admitting: Family Medicine

## 2018-11-19 DIAGNOSIS — N649 Disorder of breast, unspecified: Secondary | ICD-10-CM | POA: Diagnosis not present

## 2018-11-19 DIAGNOSIS — L988 Other specified disorders of the skin and subcutaneous tissue: Secondary | ICD-10-CM

## 2018-11-19 NOTE — Progress Notes (Signed)
Virtual Visit via Video Note  I connected with Elizabeth [name] on 11/19/18 at 10:00 AM EDT by a video enabled telemedicine application and verified that I am speaking with the correct person using two identifiers.  Location: Patient: home  Provider: home    I discussed the limitations of evaluation and management by telemedicine and the availability of in person appointments. The patient expressed understanding and agreed to proceed.  History of Present Illness: Pt is home c/o dark spot on her left breast.     Observations/Objective: No vitals obtained L breast---  Hyperpigmented lesion L breast lat to nipple-- about 1 cm diam, raised per pt and new + very itchy  Assessment and Plan: 1. Lesion of skin of breast Diagnostic mammogram If normal consider derm referral--- pt will try otc cortisone / eucerin  - MM Digital Diagnostic Bilat; Future - US BREAST COMPLETE UNI LEFT INC AXILLA; Future   Follow Up Instructions:    I discussed the assessment and treatment plan with the patient. The patient was provided an opportunity to ask questions and all were answered. The patient agreed with the plan and demonstrated an understanding of the instructions.   The patient was advised to call back or seek an in-person evaluation if the symptoms worsen or if the condition fails to improve as anticipated.  I provided 15 minutes of non-face-to-face time during this encounter.   Ann Held, DO

## 2018-12-20 ENCOUNTER — Encounter: Payer: Self-pay | Admitting: Family Medicine

## 2018-12-20 LAB — HM MAMMOGRAPHY

## 2019-04-29 ENCOUNTER — Encounter: Payer: 59 | Admitting: Family Medicine

## 2019-06-18 ENCOUNTER — Encounter: Payer: Self-pay | Admitting: Family Medicine

## 2019-06-27 ENCOUNTER — Encounter: Payer: 59 | Admitting: Family Medicine

## 2019-08-01 ENCOUNTER — Encounter: Payer: Self-pay | Admitting: Family Medicine

## 2019-08-01 ENCOUNTER — Ambulatory Visit (INDEPENDENT_AMBULATORY_CARE_PROVIDER_SITE_OTHER): Payer: 59 | Admitting: Family Medicine

## 2019-08-01 ENCOUNTER — Other Ambulatory Visit: Payer: Self-pay

## 2019-08-01 DIAGNOSIS — Z0000 Encounter for general adult medical examination without abnormal findings: Secondary | ICD-10-CM | POA: Diagnosis not present

## 2019-08-01 DIAGNOSIS — M8580 Other specified disorders of bone density and structure, unspecified site: Secondary | ICD-10-CM

## 2019-08-01 DIAGNOSIS — E559 Vitamin D deficiency, unspecified: Secondary | ICD-10-CM | POA: Diagnosis not present

## 2019-08-01 NOTE — Patient Instructions (Addendum)
COVID-19 Vaccine Information can be found at: [email or web address] For questions related to vaccine distribution or appointments, please email [email or web address] or call [phone].       Preventive Care 80-64 Years Old, Female Preventive care refers to visits with your health care provider and lifestyle choices that can promote health and wellness. This includes:  A yearly physical exam. This may also be called an annual well check.  Regular dental visits and eye exams.  Immunizations.  Screening for certain conditions.  Healthy lifestyle choices, such as eating a healthy diet, getting regular exercise, not using drugs or products that contain nicotine and tobacco, and limiting alcohol use. What can I expect for my preventive care visit? Physical exam Your health care provider will check your:  Height and weight. This may be used to calculate body mass index (BMI), which tells if you are at a healthy weight.  Heart rate and blood pressure.  Skin for abnormal spots. Counseling Your health care provider may ask you questions about your:  Alcohol, tobacco, and drug use.  Emotional well-being.  Home and relationship well-being.  Sexual activity.  Eating habits.  Work and work Statistician.  Method of birth control.  Menstrual cycle.  Pregnancy history. What immunizations do I need?  Influenza (flu) vaccine  This is recommended every year. Tetanus, diphtheria, and pertussis (Tdap) vaccine  You may need a Td booster every 10 years. Varicella (chickenpox) vaccine  You may need this if you have not been vaccinated. Zoster (shingles) vaccine  You may need this after age 57. Measles, mumps, and rubella (MMR) vaccine  You may need at least one dose of MMR if you were born in 1957 or later. You may also need a second dose. Pneumococcal conjugate (PCV13) vaccine  You may need this if you have certain  conditions and were not previously vaccinated. Pneumococcal polysaccharide (PPSV23) vaccine  You may need one or two doses if you smoke cigarettes or if you have certain conditions. Meningococcal conjugate (MenACWY) vaccine  You may need this if you have certain conditions. Hepatitis A vaccine  You may need this if you have certain conditions or if you travel or work in places where you may be exposed to hepatitis A. Hepatitis B vaccine  You may need this if you have certain conditions or if you travel or work in places where you may be exposed to hepatitis B. Haemophilus influenzae type b (Hib) vaccine  You may need this if you have certain conditions. Human papillomavirus (HPV) vaccine  If recommended by your health care provider, you may need three doses over 6 months. You may receive vaccines as individual doses or as more than one vaccine together in one shot (combination vaccines). Talk with your health care provider about the risks and benefits of combination vaccines. What tests do I need? Blood tests  Lipid and cholesterol levels. These may be checked every 5 years, or more frequently if you are over 24 years old.  Hepatitis C test.  Hepatitis B test. Screening  Lung cancer screening. You may have this screening every year starting at age 47 if you have a 30-pack-year history of smoking and currently smoke or have quit within the past 15 years.  Colorectal cancer screening. All adults should have this screening starting at age 58 and continuing until age 64. Your health care provider may recommend screening at age 48 if you are at increased risk. You will have tests every 1-10 years, depending on your results and the type  of screening test.  Diabetes screening. This is done by checking your blood sugar (glucose) after you have not eaten for a while (fasting). You may have this done every 1-3 years.  Mammogram. This may be done every 1-2 years. Talk with your health care  provider about when you should start having regular mammograms. This may depend on whether you have a family history of breast cancer.  BRCA-related cancer screening. This may be done if you have a family history of breast, ovarian, tubal, or peritoneal cancers.  Pelvic exam and Pap test. This may be done every 3 years starting at age 67. Starting at age 18, this may be done every 5 years if you have a Pap test in combination with an HPV test. Other tests  Sexually transmitted disease (STD) testing.  Bone density scan. This is done to screen for osteoporosis. You may have this scan if you are at high risk for osteoporosis. Follow these instructions at home: Eating and drinking  Eat a diet that includes fresh fruits and vegetables, whole grains, lean protein, and low-fat dairy.  Take vitamin and mineral supplements as recommended by your health care provider.  Do not drink alcohol if: ? Your health care provider tells you not to drink. ? You are pregnant, may be pregnant, or are planning to become pregnant.  If you drink alcohol: ? Limit how much you have to 0-1 drink a day. ? Be aware of how much alcohol is in your drink. In the [place] one drink equals one 12 oz bottle of beer (355 mL), one 5 oz glass of wine (148 mL), or one 1 oz glass of hard liquor (44 mL). Lifestyle  Take daily care of your teeth and gums.  Stay active. Exercise for at least 30 minutes on 5 or more days each week.  Do not use any products that contain nicotine or tobacco, such as cigarettes, e-cigarettes, and chewing tobacco. If you need help quitting, ask your health care provider.  If you are sexually active, practice safe sex. Use a condom or other form of birth control (contraception) in order to prevent pregnancy and STIs (sexually transmitted infections).  If told by your health care provider, take low-dose aspirin daily starting at age 8. What's next?  Visit your health care provider once a year for a  well check visit.  Ask your health care provider how often you should have your eyes and teeth checked.  Stay up to date on all vaccines. This information is not intended to replace advice given to you by your health care provider. Make sure you discuss any questions you have with your health care provider. Document Revised: 02/08/2018 Document Reviewed: 02/08/2018 Elsevier Patient Education  2020 [employer].

## 2019-08-01 NOTE — Progress Notes (Signed)
Virtual Visit via Video Note  I connected with Elizabeth [name] on 08/01/19 at  1:40 PM EST by a video enabled telemedicine application and verified that I am speaking with the correct person using two identifiers.  Location: Patient: home  Provider: office   I discussed the limitations of evaluation and management by telemedicine and the availability of in person appointments. The patient expressed understanding and agreed to proceed.  History of Present Illness: Pt is home with no complaints She needs cpe and labs   Past Medical History:  Diagnosis Date  . Acne   . Eczema    No current outpatient medications on file prior to visit.   No current facility-administered medications on file prior to visit.   Social History   Socioeconomic History  . Marital status: Single    Spouse name: Not on file  . Number of children: 1  . Years of education: 82  . Highest education level: Not on file  Occupational History  . Occupation: Product/process development scientist: [employer]  Tobacco Use  . Smoking status: Never Smoker  . Smokeless tobacco: Never Used  Substance and Sexual Activity  . Alcohol use: No  . Drug use: No  . Sexual activity: Never    Partners: Male  Other Topics Concern  . Not on file  Social History Narrative   Exercise--- walk   Social Determinants of Health   Financial Resource Strain:   . Difficulty of Paying Living Expenses: Not on file  Food Insecurity:   . Worried About Charity fundraiser in the Last Year: Not on file  . Ran Out of Food in the Last Year: Not on file  Transportation Needs:   . Lack of Transportation (Medical): Not on file  . Lack of Transportation (Non-Medical): Not on file  Physical Activity:   . Days of Exercise per Week: Not on file  . Minutes of Exercise per Session: Not on file  Stress:   . Feeling of Stress : Not on file  Social Connections:   . Frequency of Communication with Friends and Family: Not on file  . Frequency of  Social Gatherings with Friends and Family: Not on file  . Attends Religious Services: Not on file  . Active Member of Clubs or Organizations: Not on file  . Attends Archivist Meetings: Not on file  . Marital Status: Not on file  Intimate Partner Violence:   . Fear of Current or Ex-Partner: Not on file  . Emotionally Abused: Not on file  . Physically Abused: Not on file  . Sexually Abused: Not on file   No current outpatient medications on file prior to visit.   No current facility-administered medications on file prior to visit.   Allergies  Allergen Reactions  . 2-(Ethylmercuriothio)Benzoic Acid     Other reaction(s): Other (See Comments) Positive patch test  . Hydroxyethyl Methacrylate Rash    Positive patch test  . Other Rash    Ethyl cyanoacrylate- Positive patch test Methyl methacrylate- Positive patch test      Observations/Objective: There were no vitals filed for this visit. There is no height or weight on file to calculate BMI.    Assessment and Plan: 1. Vitamin D deficiency bmd due later this year    2. Preventative health care ghm utd Check labs D/w pt exercise/ healthy diet  - TSH; Future - Lipid panel; Future - CBC with Differential/Platelet; Future - Comprehensive metabolic panel; Future - VITAMIN D  25 Hydroxy (Vit-D Deficiency, Fractures); Future  3. Osteopenia, unspecified location Check labs  - VITAMIN D 25 Hydroxy (Vit-D Deficiency, Fractures); Future   Follow Up Instructions:    I discussed the assessment and treatment plan with the patient. The patient was provided an opportunity to ask questions and all were answered. The patient agreed with the plan and demonstrated an understanding of the instructions.   The patient was advised to call back or seek an in-person evaluation if the symptoms worsen or if the condition fails to improve as anticipated.  I provided 30 minutes of non-face-to-face time during this  encounter.   Ann Held, DO

## 2019-08-23 ENCOUNTER — Ambulatory Visit: Payer: 59

## 2019-08-23 ENCOUNTER — Other Ambulatory Visit (INDEPENDENT_AMBULATORY_CARE_PROVIDER_SITE_OTHER): Payer: 59

## 2019-08-23 ENCOUNTER — Other Ambulatory Visit: Payer: Self-pay

## 2019-08-23 VITALS — BP 118/70 | HR 58

## 2019-08-23 DIAGNOSIS — Z0000 Encounter for general adult medical examination without abnormal findings: Secondary | ICD-10-CM

## 2019-08-23 DIAGNOSIS — M8580 Other specified disorders of bone density and structure, unspecified site: Secondary | ICD-10-CM

## 2019-08-23 DIAGNOSIS — E785 Hyperlipidemia, unspecified: Secondary | ICD-10-CM

## 2019-08-23 LAB — CBC WITH DIFFERENTIAL/PLATELET
Basophils Absolute: 0.1 10*3/uL (ref 0.0–0.1)
Basophils Relative: 1 % (ref 0.0–3.0)
Eosinophils Absolute: 0.1 10*3/uL (ref 0.0–0.7)
Eosinophils Relative: 2.7 % (ref 0.0–5.0)
HCT: 40.1 % (ref 36.0–46.0)
Hemoglobin: 13.5 g/dL (ref 12.0–15.0)
Lymphocytes Relative: 40.2 % (ref 12.0–46.0)
Lymphs Abs: 2 10*3/uL (ref 0.7–4.0)
MCHC: 33.7 g/dL (ref 30.0–36.0)
MCV: 92.6 fl (ref 78.0–100.0)
Monocytes Absolute: 0.5 10*3/uL (ref 0.1–1.0)
Monocytes Relative: 10.3 % (ref 3.0–12.0)
Neutro Abs: 2.3 10*3/uL (ref 1.4–7.7)
Neutrophils Relative %: 45.8 % (ref 43.0–77.0)
Platelets: 214 10*3/uL (ref 150.0–400.0)
RBC: 4.33 Mil/uL (ref 3.87–5.11)
RDW: 12.3 % (ref 11.5–15.5)
WBC: 4.9 10*3/uL (ref 4.0–10.5)

## 2019-08-23 LAB — COMPREHENSIVE METABOLIC PANEL
ALT: 25 U/L (ref 0–35)
AST: 25 U/L (ref 0–37)
Albumin: 4 g/dL (ref 3.5–5.2)
Alkaline Phosphatase: 62 U/L (ref 39–117)
BUN: 18 mg/dL (ref 6–23)
CO2: 28 mEq/L (ref 19–32)
Calcium: 9.9 mg/dL (ref 8.4–10.5)
Chloride: 105 mEq/L (ref 96–112)
Creatinine, Ser: 0.89 mg/dL (ref 0.40–1.20)
GFR: 77.3 mL/min (ref >60.00)
Glucose, Bld: 92 mg/dL (ref 70–99)
Potassium: 4.2 mEq/L (ref 3.5–5.1)
Sodium: 139 mEq/L (ref 135–145)
Total Bilirubin: 1 mg/dL (ref 0.2–1.2)
Total Protein: 7.3 g/dL (ref 6.0–8.3)

## 2019-08-23 LAB — LIPID PANEL
Cholesterol: 204 mg/dL — ABNORMAL HIGH (ref 0–200)
HDL: 55.2 mg/dL (ref >39.00)
LDL Cholesterol: 125 mg/dL — ABNORMAL HIGH (ref 0–99)
NonHDL: 148.32
Total CHOL/HDL Ratio: 4
Triglycerides: 119 mg/dL (ref 0.0–149.0)
VLDL: 23.8 mg/dL (ref 0.0–40.0)

## 2019-08-23 LAB — VITAMIN D 25 HYDROXY (VIT D DEFICIENCY, FRACTURES): VITD: 30.57 ng/mL (ref 30.00–100.00)

## 2019-08-23 LAB — TSH: TSH: 0.62 u[IU]/mL (ref 0.35–4.50)

## 2019-08-23 NOTE — Progress Notes (Signed)
Per PCP, patient in for vital sign check (and labs) after virtual visit with PCP on 08/01/2019.   Patient currently not taking anti-hypertensive.   BP: 118/70 HR: 58 POX: 99 %  Pt advised she will receive call with any changes/updates.   Gerilyn Nestle, RN

## 2019-08-29 NOTE — Addendum Note (Signed)
Addended byDamita Dunnings D on: 08/29/2019 01:41 PM   Modules accepted: Orders

## 2019-12-25 LAB — HM MAMMOGRAPHY

## 2020-05-15 ENCOUNTER — Ambulatory Visit: Payer: 59 | Attending: Internal Medicine

## 2020-05-15 DIAGNOSIS — Z23 Encounter for immunization: Secondary | ICD-10-CM

## 2020-05-15 NOTE — Progress Notes (Signed)
   Covid-19 Vaccination Clinic  Name:  Kailah Pennel    MRN: [id number] DOB: 12-27-1955  05/15/2020  Ms. Hilleary was observed post Covid-19 immunization for 15 minutes without incident. She was provided with Vaccine Information Sheet and instruction to access the V-Safe system.   Ms. Slutsky was instructed to call 911 with any severe reactions post vaccine: Marland Kitchen Difficulty breathing  . Swelling of face and throat  . A fast heartbeat  . A bad rash all over body  . Dizziness and weakness   Immunizations Administered    Name Date Dose VIS Date Route   Pfizer COVID-19 Vaccine 05/15/2020  5:32 PM 0.3 mL 04/01/2020 Intramuscular   Manufacturer: [place]   Lot: [id number]   NDC: [address]-1

## 2020-08-06 ENCOUNTER — Other Ambulatory Visit: Payer: Self-pay

## 2020-08-07 ENCOUNTER — Other Ambulatory Visit: Payer: Self-pay

## 2020-08-07 ENCOUNTER — Ambulatory Visit (INDEPENDENT_AMBULATORY_CARE_PROVIDER_SITE_OTHER): Payer: 59 | Admitting: Family Medicine

## 2020-08-07 ENCOUNTER — Encounter: Payer: Self-pay | Admitting: Family Medicine

## 2020-08-07 VITALS — BP 110/70 | HR 71 | Temp 97.9°F | Resp 18 | Ht 62.0 in | Wt 186.8 lb

## 2020-08-07 DIAGNOSIS — Z0000 Encounter for general adult medical examination without abnormal findings: Secondary | ICD-10-CM | POA: Diagnosis not present

## 2020-08-07 LAB — CBC WITH DIFFERENTIAL/PLATELET
Basophils Absolute: 0 10*3/uL (ref 0.0–0.1)
Basophils Relative: 0.8 % (ref 0.0–3.0)
Eosinophils Absolute: 0.1 10*3/uL (ref 0.0–0.7)
Eosinophils Relative: 2.2 % (ref 0.0–5.0)
HCT: 40.6 % (ref 36.0–46.0)
Hemoglobin: 13.6 g/dL (ref 12.0–15.0)
Lymphocytes Relative: 37.4 % (ref 12.0–46.0)
Lymphs Abs: 2 10*3/uL (ref 0.7–4.0)
MCHC: 33.5 g/dL (ref 30.0–36.0)
MCV: 92.4 fl (ref 78.0–100.0)
Monocytes Absolute: 0.6 10*3/uL (ref 0.1–1.0)
Monocytes Relative: 10.8 % (ref 3.0–12.0)
Neutro Abs: 2.6 10*3/uL (ref 1.4–7.7)
Neutrophils Relative %: 48.8 % (ref 43.0–77.0)
Platelets: 201 10*3/uL (ref 150.0–400.0)
RBC: 4.4 Mil/uL (ref 3.87–5.11)
RDW: 12.5 % (ref 11.5–15.5)
WBC: 5.3 10*3/uL (ref 4.0–10.5)

## 2020-08-07 LAB — COMPREHENSIVE METABOLIC PANEL
ALT: 24 U/L (ref 0–35)
AST: 21 U/L (ref 0–37)
Albumin: 4.1 g/dL (ref 3.5–5.2)
Alkaline Phosphatase: 69 U/L (ref 39–117)
BUN: 17 mg/dL (ref 6–23)
CO2: 31 mEq/L (ref 19–32)
Calcium: 9.8 mg/dL (ref 8.4–10.5)
Chloride: 103 mEq/L (ref 96–112)
Creatinine, Ser: 0.9 mg/dL (ref 0.40–1.20)
GFR: 67.43 mL/min (ref >60.00)
Glucose, Bld: 94 mg/dL (ref 70–99)
Potassium: 3.8 mEq/L (ref 3.5–5.1)
Sodium: 139 mEq/L (ref 135–145)
Total Bilirubin: 0.8 mg/dL (ref 0.2–1.2)
Total Protein: 7.2 g/dL (ref 6.0–8.3)

## 2020-08-07 LAB — LIPID PANEL
Cholesterol: 209 mg/dL — ABNORMAL HIGH (ref 0–200)
HDL: 52.3 mg/dL (ref >39.00)
LDL Cholesterol: 129 mg/dL — ABNORMAL HIGH (ref 0–99)
NonHDL: 156.36
Total CHOL/HDL Ratio: 4
Triglycerides: 139 mg/dL (ref 0.0–149.0)
VLDL: 27.8 mg/dL (ref 0.0–40.0)

## 2020-08-07 LAB — TSH: TSH: 1.4 u[IU]/mL (ref 0.35–4.50)

## 2020-08-07 NOTE — Progress Notes (Signed)
Subjective:     Elizabeth [name] is a 65 y.o. female and is here for a comprehensive physical exam. The patient reports no problems.  Social History   Socioeconomic History  . Marital status: Single    Spouse name: Not on file  . Number of children: 1  . Years of education: 45  . Highest education level: Not on file  Occupational History  . Occupation: Product/process development scientist: [employer]  Tobacco Use  . Smoking status: Never Smoker  . Smokeless tobacco: Never Used  Vaping Use  . Vaping Use: Never used  Substance and Sexual Activity  . Alcohol use: No  . Drug use: No  . Sexual activity: Never    Partners: Male  Other Topics Concern  . Not on file  Social History Narrative   Exercise--- walk   Social Determinants of Health   Financial Resource Strain: Not on file  Food Insecurity: Not on file  Transportation Needs: Not on file  Physical Activity: Not on file  Stress: Not on file  Social Connections: Not on file  Intimate Partner Violence: Not on file   Health Maintenance  Topic Date Due  . PAP SMEAR-Modifier  06/04/2016  . DEXA SCAN  04/02/2020  . MAMMOGRAM  12/24/2020  . COLONOSCOPY (Pts 45-74yrs Insurance coverage will need to be confirmed)  05/21/2023  . TETANUS/TDAP  09/16/2025  . COVID-19 Vaccine  Completed  . Hepatitis C Screening  Completed  . HIV Screening  Completed  . INFLUENZA VACCINE  Discontinued    The following portions of the patient's history were reviewed and updated as appropriate:  She  has a past medical history of Acne and Eczema. She does not have any pertinent problems on file. She  has a past surgical history that includes Tubal ligation. Her family history includes Aneurysm in her maternal grandfather; Breast cancer in her maternal aunt and sister; Cancer (age of onset: 1) in her sister; Dementia (age of onset: 81) in her sister; Diabetes in her brother; Heart disease (age of onset: 48) in her father; Hepatitis (age of onset: 66)  in her mother; Hyperlipidemia in her brother; Hypertension in her brother. She  reports that she has never smoked. She has never used smokeless tobacco. She reports that she does not drink alcohol and does not use drugs. She currently has no medications in their medication list. No current outpatient medications on file prior to visit.   No current facility-administered medications on file prior to visit.   She is allergic to 2-(ethylmercuriothio)benzoic acid, hydroxyethyl methacrylate, and other..  Review of Systems Review of Systems  Constitutional: Negative for activity change, appetite change and fatigue.  HENT: Negative for hearing loss, congestion, tinnitus and ear discharge.  dentist q40m Eyes: Negative for visual disturbance (see optho q1y -- vision corrected to 20/20 with glasses).  Respiratory: Negative for cough, chest tightness and shortness of breath.   Cardiovascular: Negative for chest pain, palpitations and leg swelling.  Gastrointestinal: Negative for abdominal pain, diarrhea, constipation and abdominal distention.  Genitourinary: Negative for urgency, frequency, decreased urine volume and difficulty urinating.  Musculoskeletal: Negative for back pain, arthralgias and gait problem.  Skin: Negative for color change, pallor and rash.  Neurological: Negative for dizziness, light-headedness, numbness and headaches.  Hematological: Negative for adenopathy. Does not bruise/bleed easily.  Psychiatric/Behavioral: Negative for suicidal ideas, confusion, sleep disturbance, self-injury, dysphoric mood, decreased concentration and agitation.       Objective:    BP 110/70 (BP  Location: Right Arm, Patient Position: Sitting, Cuff Size: Normal)   Pulse 71   Temp 97.9 F (36.6 C) (Oral)   Resp 18   Ht 5\' 2"  (1.575 m)   Wt 186 lb 12.8 oz (84.7 kg)   SpO2 96%   BMI 34.17 kg/m  General appearance: alert, cooperative, appears stated age and no distress Head: Normocephalic, without  obvious abnormality, atraumatic Eyes: conjunctivae/corneas clear. PERRL, EOM's intact. Fundi benign. Ears: normal TM's and external ear canals both ears Neck: no adenopathy, no carotid bruit, no JVD, supple, symmetrical, trachea midline and thyroid not enlarged, symmetric, no tenderness/mass/nodules Back: symmetric, no curvature. ROM normal. No CVA tenderness. Lungs: clear to auscultation bilaterally Breasts: gyn Heart: regular rate and rhythm, S1, S2 normal, no murmur, click, rub or gallop Abdomen: soft, non-tender; bowel sounds normal; no masses,  no organomegaly Pelvic: deferred --gyn Extremities: extremities normal, atraumatic, no cyanosis or edema Pulses: 2+ and symmetric Skin: Skin color, texture, turgor normal. No rashes or lesions Lymph nodes: Cervical, supraclavicular, and axillary nodes normal. Neurologic: Alert and oriented X 3, normal strength and tone. Normal symmetric reflexes. Normal coordination and gait    Assessment:    Healthy female exam.     Plan:     ghm utd Check labs  See After Visit Summary for Counseling Recommendations    1. Preventative health care See above  - Lipid panel - CBC with Differential/Platelet - TSH - Comprehensive metabolic panel

## 2020-08-07 NOTE — Patient Instructions (Signed)
Preventive Care 84-65 Years Old, Female Preventive care refers to lifestyle choices and visits with your health care provider that can promote health and wellness. This includes:  A yearly physical exam. This is also called an annual wellness visit.  Regular dental and eye exams.  Immunizations.  Screening for certain conditions.  Healthy lifestyle choices, such as: ? Eating a healthy diet. ? Getting regular exercise. ? Not using drugs or products that contain nicotine and tobacco. ? Limiting alcohol use. What can I expect for my preventive care visit? Physical exam Your health care provider will check your:  Height and weight. These may be used to calculate your BMI (body mass index). BMI is a measurement that tells if you are at a healthy weight.  Heart rate and blood pressure.  Body temperature.  Skin for abnormal spots. Counseling Your health care provider may ask you questions about your:  Past medical problems.  Family's medical history.  Alcohol, tobacco, and drug use.  Emotional well-being.  Home life and relationship well-being.  Sexual activity.  Diet, exercise, and sleep habits.  Work and work Statistician.  Access to firearms.  Method of birth control.  Menstrual cycle.  Pregnancy history. What immunizations do I need? Vaccines are usually given at various ages, according to a schedule. Your health care provider will recommend vaccines for you based on your age, medical history, and lifestyle or other factors, such as travel or where you work.   What tests do I need? Blood tests  Lipid and cholesterol levels. These may be checked every 5 years, or more often if you are over 3 years old.  Hepatitis C test.  Hepatitis B test. Screening  Lung cancer screening. You may have this screening every year starting at age 73 if you have a 30-pack-year history of smoking and currently smoke or have quit within the past 15 years.  Colorectal cancer  screening. ? All adults should have this screening starting at age 52 and continuing until age 17. ? Your health care provider may recommend screening at age 49 if you are at increased risk. ? You will have tests every 1-10 years, depending on your results and the type of screening test.  Diabetes screening. ? This is done by checking your blood sugar (glucose) after you have not eaten for a while (fasting). ? You may have this done every 1-3 years.  Mammogram. ? This may be done every 1-2 years. ? Talk with your health care provider about when you should start having regular mammograms. This may depend on whether you have a family history of breast cancer.  BRCA-related cancer screening. This may be done if you have a family history of breast, ovarian, tubal, or peritoneal cancers.  Pelvic exam and Pap test. ? This may be done every 3 years starting at age 10. ? Starting at age 11, this may be done every 5 years if you have a Pap test in combination with an HPV test. Other tests  STD (sexually transmitted disease) testing, if you are at risk.  Bone density scan. This is done to screen for osteoporosis. You may have this scan if you are at high risk for osteoporosis. Talk with your health care provider about your test results, treatment options, and if necessary, the need for more tests. Follow these instructions at home: Eating and drinking  Eat a diet that includes fresh fruits and vegetables, whole grains, lean protein, and low-fat dairy products.  Take vitamin and mineral supplements  as recommended by your health care provider.  Do not drink alcohol if: ? Your health care provider tells you not to drink. ? You are pregnant, may be pregnant, or are planning to become pregnant.  If you drink alcohol: ? Limit how much you have to 0-1 drink a day. ? Be aware of how much alcohol is in your drink. In the [place] one drink equals one 12 oz bottle of beer (355 mL), one 5 oz glass of  wine (148 mL), or one 1 oz glass of hard liquor (44 mL).   Lifestyle  Take daily care of your teeth and gums. Brush your teeth every morning and night with fluoride toothpaste. Floss one time each day.  Stay active. Exercise for at least 30 minutes 5 or more days each week.  Do not use any products that contain nicotine or tobacco, such as cigarettes, e-cigarettes, and chewing tobacco. If you need help quitting, ask your health care provider.  Do not use drugs.  If you are sexually active, practice safe sex. Use a condom or other form of protection to prevent STIs (sexually transmitted infections).  If you do not wish to become pregnant, use a form of birth control. If you plan to become pregnant, see your health care provider for a prepregnancy visit.  If told by your health care provider, take low-dose aspirin daily starting at age 50.  Find healthy ways to cope with stress, such as: ? Meditation, yoga, or listening to music. ? Journaling. ? Talking to a trusted person. ? Spending time with friends and family. Safety  Always wear your seat belt while driving or riding in a vehicle.  Do not drive: ? If you have been drinking alcohol. Do not ride with someone who has been drinking. ? When you are tired or distracted. ? While texting.  Wear a helmet and other protective equipment during sports activities.  If you have firearms in your house, make sure you follow all gun safety procedures. What's next?  Visit your health care provider once a year for an annual wellness visit.  Ask your health care provider how often you should have your eyes and teeth checked.  Stay up to date on all vaccines. This information is not intended to replace advice given to you by your health care provider. Make sure you discuss any questions you have with your health care provider. Document Revised: 03/03/2020 Document Reviewed: 02/08/2018 Elsevier Patient Education  2021 [employer]  

## 2020-11-06 ENCOUNTER — Ambulatory Visit (INDEPENDENT_AMBULATORY_CARE_PROVIDER_SITE_OTHER): Payer: 59 | Admitting: Podiatry

## 2020-11-06 ENCOUNTER — Other Ambulatory Visit: Payer: Self-pay

## 2020-11-06 ENCOUNTER — Ambulatory Visit (INDEPENDENT_AMBULATORY_CARE_PROVIDER_SITE_OTHER): Payer: 59

## 2020-11-06 DIAGNOSIS — L03031 Cellulitis of right toe: Secondary | ICD-10-CM | POA: Diagnosis not present

## 2020-11-06 DIAGNOSIS — M722 Plantar fascial fibromatosis: Secondary | ICD-10-CM

## 2020-11-06 DIAGNOSIS — T148XXA Other injury of unspecified body region, initial encounter: Secondary | ICD-10-CM | POA: Diagnosis not present

## 2020-11-06 DIAGNOSIS — M79671 Pain in right foot: Secondary | ICD-10-CM

## 2020-11-06 MED ORDER — DOXYCYCLINE HYCLATE 100 MG PO TABS: 100.0000 mg | ORAL_TABLET | Freq: Two times a day (BID) | ORAL | 0 refills | Status: DC

## 2020-11-10 ENCOUNTER — Other Ambulatory Visit: Payer: Self-pay | Admitting: Podiatry

## 2020-11-10 DIAGNOSIS — M722 Plantar fascial fibromatosis: Secondary | ICD-10-CM

## 2020-11-11 ENCOUNTER — Encounter: Payer: Self-pay | Admitting: Podiatry

## 2020-11-11 NOTE — Progress Notes (Signed)
  Subjective:  Patient ID: Elizabeth [name], female    DOB: [birth year],  MRN: [id number]  Chief Complaint  Patient presents with  . Toe Pain    Right foot 3rd toe  PT has swelling and pain     65 y.o. female presents with the above complaint.  Patient presents with concern of right third digit paronychia/contusion.  Patient states that it is very swollen and painful.  There is no injury that she can recall.  It came out of nowhere.  She does not have any history of ingrown's.  She has not seen anyone else prior to seeing me.  She denies any other acute complaints.  She does not know if this infection versus contusion.  Pain scale is 7 out of 10 sharp shooting in nature pain with walking   Review of Systems: Negative except as noted in the HPI. Denies N/V/F/Ch.  Past Medical History:  Diagnosis Date  . Acne   . Eczema     Current Outpatient Medications:  .  doxycycline (VIBRA-TABS) 100 MG tablet, Take 1 tablet (100 mg total) by mouth 2 (two) times daily., Disp: 20 tablet, Rfl: 0  Social History   Tobacco Use  Smoking Status Never Smoker  Smokeless Tobacco Never Used    Allergies  Allergen Reactions  . 2-(Ethylmercuriothio)Benzoic Acid     Other reaction(s): Other (See Comments) Positive patch test  . Hydroxyethyl Methacrylate Rash    Positive patch test  . Other Rash    Ethyl cyanoacrylate- Positive patch test Methyl methacrylate- Positive patch test   Objective:  There were no vitals filed for this visit. There is no height or weight on file to calculate BMI. Constitutional Well developed. Well nourished.  Vascular Dorsalis pedis pulses palpable bilaterally. Posterior tibial pulses palpable bilaterally. Capillary refill normal to all digits.  No cyanosis or clubbing noted. Pedal hair growth normal.  Neurologic Normal speech. Oriented to person, place, and time. Epicritic sensation to light touch grossly present bilaterally.  Dermatologic Nails well groomed and  normal in appearance. No open wounds. No skin lesions.  Orthopedic:  Pain on palpation of right third digit proximal nail fold pain with range of motion of the IPJ.  Generalized edema noted to this third digit.  No clinical signs of infection noted except for mild redness around the proximal nail fold.  No ingrown's noted   Radiographs: 3 views of skeletally mature the right toe: No fractures noted.No other bony abnormalities identified.  Mild bunion deformity noted.  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 Assessment:   1. Paronychia, toe, right   2. Contusion of soft tissue    Plan:  Patient was evaluated and treated and all questions answered.  Right third digit paronychia versus soft tissue contusion -I explained to the patient the etiology of paronychia and various treatment options were discussed.  I discussed with the patient that the etiology of the paronychia/soft tissue contusion is unknown however we can begin treating it accordingly.  I will place her on doxycycline for 10 days to help with the paronychia and surgical shoe to help with the contusion.  Patient states understanding would like to get treated for both. -Surgical shoe was dispensed -Doxycycline was sent to the pharmacy  No follow-ups on file.

## 2020-12-11 ENCOUNTER — Other Ambulatory Visit: Payer: Self-pay

## 2020-12-11 ENCOUNTER — Encounter: Payer: Self-pay | Admitting: Podiatry

## 2020-12-11 ENCOUNTER — Ambulatory Visit (INDEPENDENT_AMBULATORY_CARE_PROVIDER_SITE_OTHER): Payer: 59 | Admitting: Podiatry

## 2020-12-11 DIAGNOSIS — T148XXA Other injury of unspecified body region, initial encounter: Secondary | ICD-10-CM

## 2020-12-11 DIAGNOSIS — L03031 Cellulitis of right toe: Secondary | ICD-10-CM

## 2020-12-11 NOTE — Progress Notes (Signed)
  Subjective:  Patient ID: Elizabeth [name], female    DOB: 12-01-1955,  MRN: [id number]  Chief Complaint  Patient presents with   Toe Pain    Right foot 3rd toe  PT stated that she is doing better she has no concerns and no pain at this time     65 y.o. female presents with the above complaint.  Patient presents with follow-up of right third digit paronychia/contusion.  She states she is doing a lot better 100% healed she no longer has any pain.  She has completed her course of antibiotics she denies any other acute complaints.   Review of Systems: Negative except as noted in the HPI. Denies N/V/F/Ch.  Past Medical History:  Diagnosis Date   Acne    Eczema     Current Outpatient Medications:    doxycycline (VIBRA-TABS) 100 MG tablet, Take 1 tablet (100 mg total) by mouth 2 (two) times daily., Disp: 20 tablet, Rfl: 0  Social History   Tobacco Use  Smoking Status Never  Smokeless Tobacco Never    Allergies  Allergen Reactions   2-(Ethylmercuriothio)Benzoic Acid     Other reaction(s): Other (See Comments) Positive patch test   Hydroxyethyl Methacrylate Rash    Positive patch test   Other Rash    Ethyl cyanoacrylate- Positive patch test Methyl methacrylate- Positive patch test   Objective:  There were no vitals filed for this visit. There is no height or weight on file to calculate BMI. Constitutional Well developed. Well nourished.  Vascular Dorsalis pedis pulses palpable bilaterally. Posterior tibial pulses palpable bilaterally. Capillary refill normal to all digits.  No cyanosis or clubbing noted. Pedal hair growth normal.  Neurologic Normal speech. Oriented to person, place, and time. Epicritic sensation to light touch grossly present bilaterally.  Dermatologic Nails well groomed and normal in appearance. No open wounds. No skin lesions.  Orthopedic: No further pain on palpation of right third digit proximal nail fold pain with range of motion of the IPJ.  No  generalized edema noted to this third digit.  No clinical signs of infection noted.  No ingrown's noted   Radiographs: 3 views of skeletally mature the right toe: No fractures noted.No other bony abnormalities identified.  Mild bunion deformity noted.  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 Assessment:   1. Paronychia, toe, right   2. Contusion of soft tissue     Plan:  Patient was evaluated and treated and all questions answered.  Right third digit paronychia versus soft tissue contusion -I clinically healed with 1 round of doxycycline.  At this time no pain is noted no locking no further paronychia noted.  I briefly discussed shoe gear modification as well.  If any foot and ankle issues arise in future I will asked her to come back and see me.  She states understanding  No follow-ups on file.

## 2020-12-30 LAB — HM MAMMOGRAPHY

## 2021-08-09 ENCOUNTER — Encounter: Payer: Self-pay | Admitting: Family Medicine

## 2021-08-09 ENCOUNTER — Ambulatory Visit (INDEPENDENT_AMBULATORY_CARE_PROVIDER_SITE_OTHER): Payer: 59 | Admitting: Family Medicine

## 2021-08-09 VITALS — BP 118/80 | HR 63 | Temp 97.8°F | Resp 18 | Ht 62.0 in | Wt 185.8 lb

## 2021-08-09 DIAGNOSIS — E2839 Other primary ovarian failure: Secondary | ICD-10-CM

## 2021-08-09 DIAGNOSIS — Z23 Encounter for immunization: Secondary | ICD-10-CM

## 2021-08-09 DIAGNOSIS — Z0000 Encounter for general adult medical examination without abnormal findings: Secondary | ICD-10-CM

## 2021-08-09 NOTE — Patient Instructions (Signed)
Preventive Care 65 Years and Older, Female °Preventive care refers to lifestyle choices and visits with your health care provider that can promote health and wellness. Preventive care visits are also called wellness exams. °What can I expect for my preventive care visit? °Counseling °Your health care provider may ask you questions about your: °Medical history, including: °Past medical problems. °Family medical history. °Pregnancy and menstrual history. °History of falls. °Current health, including: °Memory and ability to understand (cognition). °Emotional well-being. °Home life and relationship well-being. °Sexual activity and sexual health. °Lifestyle, including: °Alcohol, nicotine or tobacco, and drug use. °Access to firearms. °Diet, exercise, and sleep habits. °Work and work environment. °Sunscreen use. °Safety issues such as seatbelt and bike helmet use. °Physical exam °Your health care provider will check your: °Height and weight. These may be used to calculate your BMI (body mass index). BMI is a measurement that tells if you are at a healthy weight. °Waist circumference. This measures the distance around your waistline. This measurement also tells if you are at a healthy weight and may help predict your risk of certain diseases, such as type 2 diabetes and high blood pressure. °Heart rate and blood pressure. °Body temperature. °Skin for abnormal spots. °What immunizations do I need? °Vaccines are usually given at various ages, according to a schedule. Your health care provider will recommend vaccines for you based on your age, medical history, and lifestyle or other factors, such as travel or where you work. °What tests do I need? °Screening °Your health care provider may recommend screening tests for certain conditions. This may include: °Lipid and cholesterol levels. °Hepatitis C test. °Hepatitis B test. °HIV (human immunodeficiency virus) test. °STI (sexually transmitted infection) testing, if you are at  risk. °Lung cancer screening. °Colorectal cancer screening. °Diabetes screening. This is done by checking your blood sugar (glucose) after you have not eaten for a while (fasting). °Mammogram. Talk with your health care provider about how often you should have regular mammograms. °BRCA-related cancer screening. This may be done if you have a family history of breast, ovarian, tubal, or peritoneal cancers. °Bone density scan. This is done to screen for osteoporosis. °Talk with your health care provider about your test results, treatment options, and if necessary, the need for more tests. °Follow these instructions at home: °Eating and drinking ° °Eat a diet that includes fresh fruits and vegetables, whole grains, lean protein, and low-fat dairy products. Limit your intake of foods with high amounts of sugar, saturated fats, and salt. °Take vitamin and mineral supplements as recommended by your health care provider. °Do not drink alcohol if your health care provider tells you not to drink. °If you drink alcohol: °Limit how much you have to 0-1 drink a day. °Know how much alcohol is in your drink. In the [place] one drink equals one 12 oz bottle of beer (355 mL), one 5 oz glass of wine (148 mL), or one 1½ oz glass of hard liquor (44 mL). °Lifestyle °Brush your teeth every morning and night with fluoride toothpaste. Floss one time each day. °Exercise for at least 30 minutes 5 or more days each week. °Do not use any products that contain nicotine or tobacco. These products include cigarettes, chewing tobacco, and vaping devices, such as e-cigarettes. If you need help quitting, ask your health care provider. °Do not use drugs. °If you are sexually active, practice safe sex. Use a condom or other form of protection in order to prevent STIs. °Take aspirin only as told by your   health care provider. Make sure that you understand how much to take and what form to take. Work with your health care provider to find out whether it  is safe and beneficial for you to take aspirin daily. Ask your health care provider if you need to take a cholesterol-lowering medicine (statin). Find healthy ways to manage stress, such as: Meditation, yoga, or listening to music. Journaling. Talking to a trusted person. Spending time with friends and family. Minimize exposure to UV radiation to reduce your risk of skin cancer. Safety Always wear your seat belt while driving or riding in a vehicle. Do not drive: If you have been drinking alcohol. Do not ride with someone who has been drinking. When you are tired or distracted. While texting. If you have been using any mind-altering substances or drugs. Wear a helmet and other protective equipment during sports activities. If you have firearms in your house, make sure you follow all gun safety procedures. What's next? Visit your health care provider once a year for an annual wellness visit. Ask your health care provider how often you should have your eyes and teeth checked. Stay up to date on all vaccines. This information is not intended to replace advice given to you by your health care provider. Make sure you discuss any questions you have with your health care provider. Document Revised: 11/25/2020 Document Reviewed: 11/25/2020 Elsevier Patient Education  [place].

## 2021-08-09 NOTE — Progress Notes (Signed)
Subjective:   By signing my name below, I, Shehryar Baig, attest that this documentation has been prepared under the direction and in the presence of Dr. Roma Schanz, DO. 08/09/2021   Patient ID: Elizabeth [name], female    DOB: 02-Feb-1956, 66 y.o.   MRN: [id number]  Chief Complaint  Patient presents with   Annual Exam    Pt states fasting     HPI Patient is in today for a comprehensive physical exam. She is completing lab work during this visit.  She is due for a bone density scan and is interested in receiving it during her next mammogram on January 31, 2022.  She continues seeing her GYN specialist regularly every year.  Patient denies having any fever, new moles, congestion, sore throat, new muscle pain, new joint pain, chest pain, cough, SOB, wheezing, n/v/d, constipation, blood in stool, dysuria, frequency, hematuria, or headaches at this time. She has no recent changes to her family medical history.  She occasionally participates in exercise. She stopped walking regularly due to cold weather.  She is not interested in receiving a flu vaccine this year. She is interested in receiving a pneumonia vaccine during this visit. She has 3 pfizer Covid-19 vaccines at this time. She was informed of the bivalent Covid-19 vaccine and where to receive it if she is interested.  She is UTD on vision care. She is UTD on dental care.    Past Medical History:  Diagnosis Date   Acne    Eczema     Past Surgical History:  Procedure Laterality Date   TUBAL LIGATION      Family History  Problem Relation Age of Onset   Breast cancer Sister    Cancer Sister 30       breast   Hepatitis Mother 80       hep C   Heart disease Father 93       MI   Aneurysm Maternal Grandfather    Breast cancer Maternal Aunt    Diabetes Brother    Hyperlipidemia Brother    Hypertension Brother    Dementia Sister 85    Social History   Socioeconomic History   Marital status: Single    Spouse  name: Not on file   Number of children: 1   Years of education: 14   Highest education level: Not on file  Occupational History   Occupation: Product/process development scientist: [employer]    Employer: [employer]  Tobacco Use   Smoking status: Never   Smokeless tobacco: Never  Vaping Use   Vaping Use: Never used  Substance and Sexual Activity   Alcohol use: No   Drug use: No   Sexual activity: Never    Partners: Male  Other Topics Concern   Not on file  Social History Narrative   Exercise--- walk   Social Determinants of Health   Financial Resource Strain: Not on file  Food Insecurity: Not on file  Transportation Needs: Not on file  Physical Activity: Not on file  Stress: Not on file  Social Connections: Not on file  Intimate Partner Violence: Not on file    Outpatient Medications Prior to Visit  Medication Sig Dispense Refill   doxycycline (VIBRA-TABS) 100 MG tablet Take 1 tablet (100 mg total) by mouth 2 (two) times daily. 20 tablet 0   No facility-administered medications prior to visit.    Allergies  Allergen Reactions   2-(Ethylmercuriothio)Benzoic Acid  Other reaction(s): Other (See Comments) Positive patch test   Hydroxyethyl Methacrylate Rash    Positive patch test   Other Rash    Ethyl cyanoacrylate- Positive patch test Methyl methacrylate- Positive patch test    Review of Systems  Constitutional:  Negative for fever and malaise/fatigue.  HENT:  Negative for congestion and sore throat.   Eyes:  Negative for blurred vision.  Respiratory:  Negative for cough, shortness of breath and wheezing.   Cardiovascular:  Negative for chest pain, palpitations and leg swelling.  Gastrointestinal:  Negative for abdominal pain, blood in stool, constipation, diarrhea, nausea and vomiting.  Genitourinary:  Negative for dysuria, frequency and hematuria.  Musculoskeletal:  Negative for falls, joint pain and myalgias.  Skin:  Negative for rash.       (-) Moles    Neurological:  Negative for dizziness, loss of consciousness and headaches.  Endo/Heme/Allergies:  Negative for environmental allergies.  Psychiatric/Behavioral:  Negative for depression. The patient is not nervous/anxious.       Objective:    Physical Exam Vitals and nursing note reviewed.  Constitutional:      General: She is not in acute distress.    Appearance: Normal appearance. She is well-developed. She is not ill-appearing.  HENT:     Head: Normocephalic and atraumatic.     Right Ear: Tympanic membrane, ear canal and external ear normal.     Left Ear: Tympanic membrane, ear canal and external ear normal.  Eyes:     Extraocular Movements: Extraocular movements intact.     Conjunctiva/sclera: Conjunctivae normal.     Pupils: Pupils are equal, round, and reactive to light.  Neck:     Thyroid: No thyromegaly.     Vascular: No carotid bruit or JVD.  Cardiovascular:     Rate and Rhythm: Normal rate and regular rhythm.     Heart sounds: Normal heart sounds. No murmur heard.   No gallop.  Pulmonary:     Effort: Pulmonary effort is normal. No respiratory distress.     Breath sounds: Normal breath sounds. No wheezing or rales.  Chest:     Chest wall: No tenderness.  Abdominal:     General: Bowel sounds are normal. There is no distension.     Palpations: Abdomen is soft.     Tenderness: There is no abdominal tenderness. There is no guarding.  Musculoskeletal:     Cervical back: Normal range of motion and neck supple.  Skin:    General: Skin is warm and dry.  Neurological:     Mental Status: She is alert and oriented to person, place, and time.  Psychiatric:        Judgment: Judgment normal.    BP 118/80 (BP Location: Right Arm, Patient Position: Sitting, Cuff Size: Large)    Pulse 63    Temp 97.8 F (36.6 C) (Oral)    Resp 18    Ht 5\' 2"  (1.575 m)    Wt 185 lb 12.8 oz (84.3 kg)    SpO2 97%    BMI 33.98 kg/m  Wt Readings from Last 3 Encounters:  08/09/21 185 lb 12.8 oz  (84.3 kg)  08/07/20 186 lb 12.8 oz (84.7 kg)  04/27/18 180 lb 12.8 oz (82 kg)    Diabetic Foot Exam - Simple   No data filed    Lab Results  Component Value Date   WBC 5.2 08/09/2021   HGB 13.4 08/09/2021   HCT 40.5 08/09/2021   PLT 202.0 08/09/2021  GLUCOSE 89 08/09/2021   CHOL 207 (H) 08/09/2021   TRIG 106.0 08/09/2021   HDL 57.00 08/09/2021   LDLDIRECT 140.8 04/10/2012   LDLCALC 129 (H) 08/09/2021   ALT 29 08/09/2021   AST 28 08/09/2021   NA 139 08/09/2021   K 4.0 08/09/2021   CL 104 08/09/2021   CREATININE 0.86 08/09/2021   BUN 19 08/09/2021   CO2 28 08/09/2021   TSH 0.81 08/09/2021    Lab Results  Component Value Date   TSH 0.81 08/09/2021   Lab Results  Component Value Date   WBC 5.2 08/09/2021   HGB 13.4 08/09/2021   HCT 40.5 08/09/2021   MCV 92.9 08/09/2021   PLT 202.0 08/09/2021   Lab Results  Component Value Date   NA 139 08/09/2021   K 4.0 08/09/2021   CO2 28 08/09/2021   GLUCOSE 89 08/09/2021   BUN 19 08/09/2021   CREATININE 0.86 08/09/2021   BILITOT 1.0 08/09/2021   ALKPHOS 77 08/09/2021   AST 28 08/09/2021   ALT 29 08/09/2021   PROT 7.6 08/09/2021   ALBUMIN 4.4 08/09/2021   CALCIUM 10.0 08/09/2021   GFR 70.70 08/09/2021   Lab Results  Component Value Date   CHOL 207 (H) 08/09/2021   Lab Results  Component Value Date   HDL 57.00 08/09/2021   Lab Results  Component Value Date   LDLCALC 129 (H) 08/09/2021   Lab Results  Component Value Date   TRIG 106.0 08/09/2021   Lab Results  Component Value Date   CHOLHDL 4 08/09/2021   No results found for: HGBA1C  Mammogram- Last completed 12/30/2020. Results are normal. Repeat in 1 year.  Dexa- Last completed 04/02/2018. Results showed she is osteopenic. Repeat in 2 years.  Colonoscopy- Last completed 05/20/2013. Results are normal. Repeat in 10 years.  Pap smear- Last completed 06/04/2013. Return in 3 years. Due     Assessment & Plan:   Problem List Items Addressed This  Visit       Unprioritized   Preventative health care - Primary    ghm utd Check labs  See avs       Relevant Orders   Comprehensive metabolic panel (Completed)   Lipid panel (Completed)   CBC with Differential/Platelet (Completed)   TSH (Completed)   Other Visit Diagnoses     Need for pneumococcal vaccination       Relevant Orders   Pneumococcal conjugate vaccine 20-valent (Prevnar 20) (Completed)   Estrogen deficiency       Relevant Orders   DG Bone Density        No orders of the defined types were placed in this encounter.   IAnn Held, DO, personally preformed the services described in this documentation.  All medical record entries made by the scribe were at my direction and in my presence.  I have reviewed the chart and discharge instructions (if applicable) and agree that the record reflects my personal performance and is accurate and complete. 08/09/2021    I,Shehryar Baig,acting as a scribe for Ann Held, DO.,have documented all relevant documentation on the behalf of Ann Held, DO,as directed by  Ann Held, DO while in the presence of Ann Held, DO.     Ann Held, DO

## 2021-08-10 LAB — LIPID PANEL
Cholesterol: 207 mg/dL — ABNORMAL HIGH (ref 0–200)
HDL: 57 mg/dL (ref >39.00)
LDL Cholesterol: 129 mg/dL — ABNORMAL HIGH (ref 0–99)
NonHDL: 149.81
Total CHOL/HDL Ratio: 4
Triglycerides: 106 mg/dL (ref 0.0–149.0)
VLDL: 21.2 mg/dL (ref 0.0–40.0)

## 2021-08-10 LAB — CBC WITH DIFFERENTIAL/PLATELET
Basophils Absolute: 0.1 10*3/uL (ref 0.0–0.1)
Basophils Relative: 1.3 % (ref 0.0–3.0)
Eosinophils Absolute: 0.1 10*3/uL (ref 0.0–0.7)
Eosinophils Relative: 2.5 % (ref 0.0–5.0)
HCT: 40.5 % (ref 36.0–46.0)
Hemoglobin: 13.4 g/dL (ref 12.0–15.0)
Lymphocytes Relative: 42.9 % (ref 12.0–46.0)
Lymphs Abs: 2.2 10*3/uL (ref 0.7–4.0)
MCHC: 33 g/dL (ref 30.0–36.0)
MCV: 92.9 fl (ref 78.0–100.0)
Monocytes Absolute: 0.5 10*3/uL (ref 0.1–1.0)
Monocytes Relative: 9.9 % (ref 3.0–12.0)
Neutro Abs: 2.2 10*3/uL (ref 1.4–7.7)
Neutrophils Relative %: 43.4 % (ref 43.0–77.0)
Platelets: 202 10*3/uL (ref 150.0–400.0)
RBC: 4.37 Mil/uL (ref 3.87–5.11)
RDW: 12.7 % (ref 11.5–15.5)
WBC: 5.2 10*3/uL (ref 4.0–10.5)

## 2021-08-10 LAB — COMPREHENSIVE METABOLIC PANEL
ALT: 29 U/L (ref 0–35)
AST: 28 U/L (ref 0–37)
Albumin: 4.4 g/dL (ref 3.5–5.2)
Alkaline Phosphatase: 77 U/L (ref 39–117)
BUN: 19 mg/dL (ref 6–23)
CO2: 28 mEq/L (ref 19–32)
Calcium: 10 mg/dL (ref 8.4–10.5)
Chloride: 104 mEq/L (ref 96–112)
Creatinine, Ser: 0.86 mg/dL (ref 0.40–1.20)
GFR: 70.7 mL/min (ref >60.00)
Glucose, Bld: 89 mg/dL (ref 70–99)
Potassium: 4 mEq/L (ref 3.5–5.1)
Sodium: 139 mEq/L (ref 135–145)
Total Bilirubin: 1 mg/dL (ref 0.2–1.2)
Total Protein: 7.6 g/dL (ref 6.0–8.3)

## 2021-08-10 LAB — TSH: TSH: 0.81 u[IU]/mL (ref 0.35–5.50)

## 2021-08-15 DIAGNOSIS — Z0000 Encounter for general adult medical examination without abnormal findings: Secondary | ICD-10-CM | POA: Insufficient documentation

## 2021-08-15 NOTE — Assessment & Plan Note (Signed)
ghm utd Check labs  See avs  

## 2021-12-31 ENCOUNTER — Encounter: Payer: Self-pay | Admitting: Family Medicine

## 2022-04-13 ENCOUNTER — Ambulatory Visit (INDEPENDENT_AMBULATORY_CARE_PROVIDER_SITE_OTHER): Payer: 59 | Admitting: Family

## 2022-04-13 VITALS — BP 128/75 | HR 71 | Temp 97.5°F | Resp 16 | Wt 175.0 lb

## 2022-04-13 DIAGNOSIS — R21 Rash and other nonspecific skin eruption: Secondary | ICD-10-CM

## 2022-04-13 MED ORDER — PREDNISONE 10 MG PO TABS: ORAL_TABLET | ORAL | 0 refills | Status: DC

## 2022-04-13 NOTE — Assessment & Plan Note (Addendum)
New.  ? Allergy mediated.  Recommended trial of prednisone taper, claritin or zytec during the day for itching. Can use benadryl HS PRN, aveeno oatmeal baths prn.  She is advised to call if symptoms worsen or if symptoms do not improve.  Might consider scabies treatment if no improvement with the prednisone.

## 2022-04-13 NOTE — Progress Notes (Signed)
Subjective:     Patient ID: Elizabeth [name], female    DOB: [birth year], 66 y.o.   MRN: [id number]  Chief Complaint  Patient presents with   Rash    Complains of skin rash throughout     Rash   Patient is in today for rash. Started over the weekend. Tried to get in with her dermatologist but they did not return her call. Very itchy. Denies change in detergent- uses clear and free. Uses only Aveeno lotion. No medications, no travel, no exposure to people with known rash.  Was not able to attribute to food.    Health Maintenance Due  Topic Date Due   COVID-19 Vaccine (4 - Pfizer series) 07/10/2020    Past Medical History:  Diagnosis Date   Acne    Eczema     Past Surgical History:  Procedure Laterality Date   TUBAL LIGATION      Family History  Problem Relation Age of Onset   Breast cancer Sister    Cancer Sister 80       breast   Hepatitis Mother 47       hep C   Heart disease Father 37       MI   Aneurysm Maternal Grandfather    Breast cancer Maternal Aunt    Diabetes Brother    Hyperlipidemia Brother    Hypertension Brother    Dementia Sister 44    Social History   Socioeconomic History   Marital status: Single    Spouse name: Not on file   Number of children: 1   Years of education: 14   Highest education level: Not on file  Occupational History   Occupation: Product/process development scientist: [employer]    Employer: [employer]  Tobacco Use   Smoking status: Never   Smokeless tobacco: Never  Vaping Use   Vaping Use: Never used  Substance and Sexual Activity   Alcohol use: No   Drug use: No   Sexual activity: Never    Partners: Male  Other Topics Concern   Not on file  Social History Narrative   Exercise--- walk   Social Determinants of Health   Financial Resource Strain: Not on file  Food Insecurity: Not on file  Transportation Needs: Not on file  Physical Activity: Not on file  Stress: Not on file  Social Connections: Not on  file  Intimate Partner Violence: Not on file    No outpatient medications prior to visit.   No facility-administered medications prior to visit.    Allergies  Allergen Reactions   2-(Ethylmercuriothio)Benzoic Acid     Other reaction(s): Other (See Comments) Positive patch test   Hydroxyethyl Methacrylate Rash    Positive patch test   Other Rash    Ethyl cyanoacrylate- Positive patch test Methyl methacrylate- Positive patch test    Review of Systems  Skin:  Positive for rash.       Objective:    Physical Exam Constitutional:      Appearance: Normal appearance.  HENT:     Head: Normocephalic and atraumatic.  Pulmonary:     Effort: Pulmonary effort is normal.  Skin:    Comments: Diffuse maculopapular rash noted on arms, back, face, legs  Neurological:     Mental Status: She is alert.     BP 128/75 (BP Location: Right Arm, Patient Position: Sitting, Cuff Size: Small)   Pulse 71   Temp (!) 97.5 F (36.4 C) (Oral)  Resp 16   Wt 175 lb (79.4 kg)   SpO2 98%   BMI 32.01 kg/m  Wt Readings from Last 3 Encounters:  04/13/22 175 lb (79.4 kg)  08/09/21 185 lb 12.8 oz (84.3 kg)  08/07/20 186 lb 12.8 oz (84.7 kg)       Assessment & Plan:   Problem List Items Addressed This Visit       Unprioritized   Skin rash - Primary    New.  ? Allergy mediated.  Recommended trial of prednisone taper, claritin or zytec during the day for itching. Can use benadryl HS PRN.  She is advised to call if symptoms worsen or if symptoms do not improve.  Might consider scabies treatment if no improvement with the prednisone.        I am having Elizabeth [name] start on predniSONE.  Meds ordered this encounter  Medications   predniSONE (DELTASONE) 10 MG tablet    Sig: 4 tabs by mouth once daily for 2 days, then 3 tabs daily x 2 days, then 2 tabs daily x 2 days, then 1 tab daily x 2 days    Dispense:  20 tablet    Refill:  0    Order Specific Question:   Supervising Provider     Answer:   Penni Homans A [[id number]]

## 2022-05-03 ENCOUNTER — Telehealth: Payer: Self-pay | Admitting: Family Medicine

## 2022-05-03 MED ORDER — PERMETHRIN 5 % EX CREA: TOPICAL_CREAM | CUTANEOUS | 1 refills | Status: DC

## 2022-05-03 NOTE — Telephone Encounter (Signed)
Will rx Permethrin, discussed application, need to wash clothes/bedding in hot water and hot dryer. If this does not help- advised her to reach out to her dermatologist. Pt verbalizes understanding.

## 2022-05-03 NOTE — Telephone Encounter (Signed)
Pt saw Melissa for a rash on her legs a couple weeks ago, but rash has now moved to her neck, and she would like to know what to do. She stated rx is not working. Please advise.

## 2022-10-14 ENCOUNTER — Ambulatory Visit (INDEPENDENT_AMBULATORY_CARE_PROVIDER_SITE_OTHER): Payer: Medicare Other | Admitting: Family Medicine

## 2022-10-14 ENCOUNTER — Encounter: Payer: Self-pay | Admitting: Family Medicine

## 2022-10-14 VITALS — BP 120/80 | HR 64 | Temp 97.7°F | Resp 18 | Ht 62.0 in | Wt 174.8 lb

## 2022-10-14 DIAGNOSIS — Z0001 Encounter for general adult medical examination with abnormal findings: Secondary | ICD-10-CM | POA: Diagnosis not present

## 2022-10-14 DIAGNOSIS — G8929 Other chronic pain: Secondary | ICD-10-CM | POA: Insufficient documentation

## 2022-10-14 DIAGNOSIS — Z0000 Encounter for general adult medical examination without abnormal findings: Secondary | ICD-10-CM | POA: Diagnosis not present

## 2022-10-14 DIAGNOSIS — M25561 Pain in right knee: Secondary | ICD-10-CM

## 2022-10-14 DIAGNOSIS — M25562 Pain in left knee: Secondary | ICD-10-CM | POA: Insufficient documentation

## 2022-10-14 NOTE — Progress Notes (Signed)
Established Patient Office Visit  Subjective   Patient ID: Elizabeth [name], female    DOB: 06/23/1955  Age: 67 y.o. MRN: [id number]  Chief Complaint  Patient presents with   Annual Exam    Pt states fasting     HPI Pt here for cpe and labs.  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   Anemia 11/29/2010   Dizzy 11/29/2010   Past Medical History:  Diagnosis Date   Acne    Eczema    Past Surgical History:  Procedure Laterality Date   TUBAL LIGATION     Social History   Tobacco Use   Smoking status: Never   Smokeless tobacco: Never  Vaping Use   Vaping Use: Never used  Substance Use Topics   Alcohol use: No   Drug use: No   Social History   Socioeconomic History   Marital status: Single    Spouse name: Not on file   Number of children: 1   Years of education: 14   Highest education level: Not on file  Occupational History   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[employer]    Employer: [employer]  Tobacco Use   Smoking status: Never   Smokeless tobacco: Never  Vaping Use   Vaping Use: Never used  Substance and Sexual Activity   Alcohol use: No   Drug use: No   Sexual activity: Never    Partners: Male  Other Topics Concern   Not on file  Social History Narrative   Exercise--- walk   Social Determinants of Health   Financial Resource Strain: Not on file  Food Insecurity: Not on file  Transportation Needs: Not on file  Physical Activity: Not on file  Stress: Not on file  Social Connections: Not on file  Intimate Partner Violence: Not on file   Family Status  Relation Name Status   Sister  Alive   Mother  Deceased at age 58        hep c   Father  Deceased at age 10   MGM  Deceased       child birth   MGF  Deceased at age 61       aneurysm   PGM  Deceased   PGF  Deceased   Youth worker  (Not Specified)   Brother  Alive   Sister  Alive   Sister  Alive   Family History  Problem Relation Age of Onset   Breast cancer Sister    Cancer Sister 71       breast   Hepatitis Mother 73       hep C   Heart disease Father 64       MI   Aneurysm Maternal Grandfather    Breast cancer Maternal Aunt    Diabetes Brother    Hyperlipidemia Brother    Hypertension Brother    Dementia Sister 6   Allergies  Allergen Reactions   2-(Ethylmercuriothio)Benzoic Acid     Other reaction(s): Other (See Comments) Positive patch test   Hydroxyethyl Methacrylate Rash    Positive patch test   Other Rash  Ethyl cyanoacrylate- Positive patch test Methyl methacrylate- Positive patch test      Review of Systems  Constitutional:  Negative for fever and malaise/fatigue.  HENT:  Negative for congestion.   Eyes:  Negative for blurred vision.  Respiratory:  Negative for cough and shortness of breath.   Cardiovascular:  Negative for chest pain, palpitations and leg swelling.  Gastrointestinal:  Negative for abdominal pain, blood in stool, nausea and vomiting.  Genitourinary:  Negative for dysuria and frequency.  Musculoskeletal:  Positive for joint pain. Negative for back pain and falls.  Skin:  Negative for rash.  Neurological:  Negative for dizziness, loss of consciousness and headaches.  Endo/Heme/Allergies:  Negative for environmental allergies.  Psychiatric/Behavioral:  Negative for depression. The patient is not nervous/anxious.       Objective:     BP 120/80 (BP Location: Left Arm, Patient Position: Sitting, Cuff Size: Normal)   Pulse 64   Temp 97.7 F (36.5 C) (Oral)   Resp 18   Ht 5\' 2"  (1.575 m)   Wt 174 lb 12.8 oz (79.3 kg)   SpO2 96%   BMI 31.97 kg/m  BP Readings from Last 3 Encounters:  10/14/22 120/80   04/13/22 128/75  08/09/21 118/80   Wt Readings from Last 3 Encounters:  10/14/22 174 lb 12.8 oz (79.3 kg)  04/13/22 175 lb (79.4 kg)  08/09/21 185 lb 12.8 oz (84.3 kg)   SpO2 Readings from Last 3 Encounters:  10/14/22 96%  04/13/22 98%  08/09/21 97%      Physical Exam Vitals and nursing note reviewed.  Constitutional:      General: She is not in acute distress.    Appearance: She is well-developed. She is not diaphoretic.  HENT:     Head: Normocephalic and atraumatic.     Right Ear: External ear normal.     Left Ear: External ear normal.     Nose: Nose normal.  Eyes:     General:        Right eye: No discharge.        Left eye: No discharge.     Conjunctiva/sclera: Conjunctivae normal.     Pupils: Pupils are equal, round, and reactive to light.  Neck:     Thyroid: No thyromegaly.     Vascular: No JVD.  Cardiovascular:     Rate and Rhythm: Normal rate and regular rhythm.     Heart sounds: Normal heart sounds. No murmur heard. Pulmonary:     Effort: Pulmonary effort is normal. No respiratory distress.     Breath sounds: Normal breath sounds. No wheezing or rales.  Chest:     Chest wall: No tenderness.  Abdominal:     General: Bowel sounds are normal. There is no distension.     Palpations: Abdomen is soft. There is no mass.     Tenderness: There is no abdominal tenderness. There is no guarding or rebound.  Genitourinary:    Vagina: Normal. No vaginal discharge.     Rectum: Guaiac result negative.  Musculoskeletal:        General: No swelling or tenderness. Normal range of motion.     Cervical back: Normal range of motion and neck supple.  Lymphadenopathy:     Cervical: No cervical adenopathy.  Skin:    General: Skin is warm and dry.     Findings: No erythema or rash.  Neurological:     General: No focal deficit present.     Mental Status: She is alert  and oriented to person, place, and time.     Cranial Nerves: No cranial nerve deficit.     Deep Tendon  Reflexes: Reflexes are normal and symmetric.  Psychiatric:        Mood and Affect: Mood normal.        Behavior: Behavior normal.        Thought Content: Thought content normal.        Judgment: Judgment normal.     No results found for any visits on 10/14/22.  Last CBC Lab Results  Component Value Date   WBC 5.2 08/09/2021   HGB 13.4 08/09/2021   HCT 40.5 08/09/2021   MCV 92.9 08/09/2021   RDW 12.7 08/09/2021   PLT 202.0 08/09/2021   Last metabolic panel Lab Results  Component Value Date   GLUCOSE 89 08/09/2021   NA 139 08/09/2021   K 4.0 08/09/2021   CL 104 08/09/2021   CO2 28 08/09/2021   BUN 19 08/09/2021   CREATININE 0.86 08/09/2021   CALCIUM 10.0 08/09/2021   PROT 7.6 08/09/2021   ALBUMIN 4.4 08/09/2021   BILITOT 1.0 08/09/2021   ALKPHOS 77 08/09/2021   AST 28 08/09/2021   ALT 29 08/09/2021   Last lipids Lab Results  Component Value Date   CHOL 207 (H) 08/09/2021   HDL 57.00 08/09/2021   LDLCALC 129 (H) 08/09/2021   LDLDIRECT 140.8 04/10/2012   TRIG 106.0 08/09/2021   CHOLHDL 4 08/09/2021   Last hemoglobin A1c No results found for: "HGBA1C" Last thyroid functions Lab Results  Component Value Date   TSH 0.81 08/09/2021   Last vitamin D Lab Results  Component Value Date   VD25OH 30.57 08/23/2019   Last vitamin B12 and Folate Lab Results  Component Value Date   VITAMINB12 271 11/29/2010   FOLATE 16.1 11/23/2009      The 10-year ASCVD risk score (Arnett DK, et al., 2019) is: 7.6%    Assessment & Plan:   Problem List Items Addressed This Visit       Unprioritized   Preventative health care - Primary    Ghm utd Check labs  See AVS  Health Maintenance  Topic Date Due   COVID-19 Vaccine (4 - 2023-24 season) 02/11/2022   INFLUENZA VACCINE  09/11/2027 (Originally 01/12/2023)   MAMMOGRAM  01/01/2023   COLONOSCOPY (Pts 45-108yrs Insurance coverage will need to be confirmed)  05/21/2023   Medicare Annual Wellness (AWV)  10/14/2023   DEXA  SCAN  01/01/2024   DTaP/Tdap/Td (3 - Td or Tdap) 09/16/2025   Pneumonia Vaccine 78+ Years old  Completed   Hepatitis C Screening  Completed   Zoster Vaccines- Shingrix  Completed   HPV VACCINES  Aged Out        Relevant Orders   CBC with Differential/Platelet   Comprehensive metabolic panel   Lipid panel   Acute pain of right knee    Voltaren gel otc Consider sport med/ ortho if no improvement       Other Visit Diagnoses     Welcome to Medicare preventive visit       Relevant Orders   EKG 12-Lead (Completed)       No follow-ups on file.    Donato Schultz, DO   Subjective:    Elizabeth [name] is a 67 y.o. female who presents for a Welcome to Medicare exam.   Review of Systems  Review of Systems  Constitutional: Negative for activity change, appetite change and fatigue.  HENT: Negative  for hearing loss, congestion, tinnitus and ear discharge.   Eyes: Negative for visual disturbance (see optho q1y -- vision corrected to 20/20 with glasses).  Respiratory: Negative for cough, chest tightness and shortness of breath.   Cardiovascular: Negative for chest pain, palpitations and leg swelling.  Gastrointestinal: Negative for abdominal pain, diarrhea, constipation and abdominal distention.  Genitourinary: Negative for urgency, frequency, decreased urine volume and difficulty urinating.  Musculoskeletal: Negative for back pain, arthralgias and gait problem.  Skin: Negative for color change, pallor and rash.  Neurological: Negative for dizziness, light-headedness, numbness and headaches.  Hematological: Negative for adenopathy. Does not bruise/bleed easily.  Psychiatric/Behavioral: Negative for suicidal ideas, confusion, sleep disturbance, self-injury, dysphoric mood, decreased concentration and agitation.  Pt is able to read and write and can do all ADLs No risk for falling No abuse/ violence in home          Objective:    Today's Vitals   10/14/22 1406  BP:  120/80  Pulse: 64  Resp: 18  Temp: 97.7 F (36.5 C)  TempSrc: Oral  SpO2: 96%  Weight: 174 lb 12.8 oz (79.3 kg)  Height: 5\' 2"  (1.575 m)  Body mass index is 31.97 kg/m.  Medications Outpatient Encounter Medications as of 10/14/2022  Medication Sig   [DISCONTINUED] permethrin (ELIMITE) 5 % cream Massage head to toe at bedtime, rinse off in AM. May repeat in 2 weeks if symptoms are not resolved.   [DISCONTINUED] predniSONE (DELTASONE) 10 MG tablet 4 tabs by mouth once daily for 2 days, then 3 tabs daily x 2 days, then 2 tabs daily x 2 days, then 1 tab daily x 2 days   No facility-administered encounter medications on file as of 10/14/2022.     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[employer]    Employer: [employer]  Tobacco Use   Smoking status: Never   Smokeless tobacco: Never  Vaping Use   Vaping Use: Never used  Substance and Sexual Activity   Alcohol use: No   Drug use: No   Sexual activity: Never    Partners: Male    Tobacco Counseling Counseling given: Not Answered   Immunizations and Health Maintenance Immunization History  Administered Date(s) Administered   PFIZER(Purple Top)SARS-COV-2 Vaccination 09/08/2019, 09/29/2019, 05/15/2020   PNEUMOCOCCAL CONJUGATE-20 08/09/2021   Td 05/25/2005   Tdap 09/17/2015   Zoster Recombinat (Shingrix) 04/27/2018, 07/06/2018   Health Maintenance Due  Topic Date Due   COVID-19 Vaccine (4 -  2023-24 season) 02/11/2022    Activities of Daily Living    10/14/2022    3:53 PM  In your present state of health, do you have any difficulty performing the following activities:  Hearing? 0  Vision? 0  Difficulty concentrating or  making decisions? 0  Walking or climbing stairs? 0  Dressing or bathing? 0  Doing errands, shopping? 0    Physical Exam  see aboe(optional), or other factors deemed appropriate based on the beneficiary's medical and social history and current clinical standards.  Advanced Directives: info mailed to pt       Assessment:    This is a routine wellness examination for this patient .   Vision/Hearing screen Vision Screening   Right eye Left eye Both eyes  Without correction     With correction 20/25 20/50 20/40   Hearing Screening - Comments:: Normal whisper   Dietary issues and exercise activities discussed:  Current Exercise Habits: Home exercise routine, Type of exercise: walking, Time (Minutes): 30, Frequency (Times/Week): 3, Weekly Exercise (Minutes/Week): 90, Intensity: Mild, Exercise limited by: None identified   Goals   None    Depression Screen    10/14/2022    2:09 PM 08/09/2021    2:13 PM 08/07/2020    9:10 AM 04/27/2018    4:41 PM  PHQ 2/9 Scores  PHQ - 2 Score 0 0 0 0  PHQ- 9 Score    0     Fall Risk    10/14/2022    2:09 PM  Fall Risk   Falls in the past year? 0  Number falls in past yr: 0  Injury with Fall? 0  Follow up Falls evaluation completed    Cognitive Function:    10/14/2022    3:53 PM  MMSE - Mini Mental State Exam  Orientation to time 5  Orientation to Place 5  Registration 3  Attention/ Calculation 5  Recall 3  Language- name 2 objects 2  Language- repeat 1  Language- follow 3 step command 3  Language- read & follow direction 1  Write a sentence 1  Copy design 1  Total score 30        Patient Care Team: Donato Schultz, DO as PCP - General Olivia Mackie, MD as Consulting Physician  (Obstetrics and Gynecology)     Plan:   Ghm utd Check labs  Health Maintenance  Topic Date Due   COVID-19 Vaccine (4 - 2023-24 season) 02/11/2022   INFLUENZA VACCINE  09/11/2027 (Originally 01/12/2023)   MAMMOGRAM  01/01/2023   COLONOSCOPY (Pts 45-63yrs Insurance coverage will need to be confirmed)  05/21/2023   Medicare Annual Wellness (AWV)  10/14/2023   DEXA SCAN  01/01/2024   DTaP/Tdap/Td (3 - Td or Tdap) 09/16/2025   Pneumonia Vaccine 47+ Years old  Completed   Hepatitis C Screening  Completed   Zoster Vaccines- Shingrix  Completed   HPV VACCINES  Aged Out     I have personally reviewed and noted the following in the patient's chart:   Medical and social history Use of alcohol, tobacco or illicit drugs  Current medications and supplements Functional ability and status Nutritional status Physical activity Advanced directives List of other physicians Hospitalizations, surgeries, and ER visits in previous 12 months Vitals Screenings to include cognitive, depression, and falls Referrals and appointments  In addition, I have reviewed and discussed with patient certain preventive protocols, quality metrics, and best practice recommendations. A written personalized care plan for preventive services as well as general preventive health recommendations were provided to patient.     Lelon Perla Chase, DO 10/14/2022

## 2022-10-14 NOTE — Patient Instructions (Signed)
Preventive Care 65 Years and Older, Female Preventive care refers to lifestyle choices and visits with your health care provider that can promote health and wellness. Preventive care visits are also called wellness exams. What can I expect for my preventive care visit? Counseling Your health care provider may ask you questions about your: Medical history, including: Past medical problems. Family medical history. Pregnancy and menstrual history. History of falls. Current health, including: Memory and ability to understand (cognition). Emotional well-being. Home life and relationship well-being. Sexual activity and sexual health. Lifestyle, including: Alcohol, nicotine or tobacco, and drug use. Access to firearms. Diet, exercise, and sleep habits. Work and work environment. Sunscreen use. Safety issues such as seatbelt and bike helmet use. Physical exam Your health care provider will check your: Height and weight. These may be used to calculate your BMI (body mass index). BMI is a measurement that tells if you are at a healthy weight. Waist circumference. This measures the distance around your waistline. This measurement also tells if you are at a healthy weight and may help predict your risk of certain diseases, such as type 2 diabetes and high blood pressure. Heart rate and blood pressure. Body temperature. Skin for abnormal spots. What immunizations do I need?  Vaccines are usually given at various ages, according to a schedule. Your health care provider will recommend vaccines for you based on your age, medical history, and lifestyle or other factors, such as travel or where you work. What tests do I need? Screening Your health care provider may recommend screening tests for certain conditions. This may include: Lipid and cholesterol levels. Hepatitis C test. Hepatitis B test. HIV (human immunodeficiency virus) test. STI (sexually transmitted infection) testing, if you are at  risk. Lung cancer screening. Colorectal cancer screening. Diabetes screening. This is done by checking your blood sugar (glucose) after you have not eaten for a while (fasting). Mammogram. Talk with your health care provider about how often you should have regular mammograms. BRCA-related cancer screening. This may be done if you have a family history of breast, ovarian, tubal, or peritoneal cancers. Bone density scan. This is done to screen for osteoporosis. Talk with your health care provider about your test results, treatment options, and if necessary, the need for more tests. Follow these instructions at home: Eating and drinking  Eat a diet that includes fresh fruits and vegetables, whole grains, lean protein, and low-fat dairy products. Limit your intake of foods with high amounts of sugar, saturated fats, and salt. Take vitamin and mineral supplements as recommended by your health care provider. Do not drink alcohol if your health care provider tells you not to drink. If you drink alcohol: Limit how much you have to 0-1 drink a day. Know how much alcohol is in your drink. In the [place] one drink equals one 12 oz bottle of beer (355 mL), one 5 oz glass of wine (148 mL), or one 1 oz glass of hard liquor (44 mL). Lifestyle Brush your teeth every morning and night with fluoride toothpaste. Floss one time each day. Exercise for at least 30 minutes 5 or more days each week. Do not use any products that contain nicotine or tobacco. These products include cigarettes, chewing tobacco, and vaping devices, such as e-cigarettes. If you need help quitting, ask your health care provider. Do not use drugs. If you are sexually active, practice safe sex. Use a condom or other form of protection in order to prevent STIs. Take aspirin only as told by   your health care provider. Make sure that you understand how much to take and what form to take. Work with your health care provider to find out whether it  is safe and beneficial for you to take aspirin daily. Ask your health care provider if you need to take a cholesterol-lowering medicine (statin). Find healthy ways to manage stress, such as: Meditation, yoga, or listening to music. Journaling. Talking to a trusted person. Spending time with friends and family. Minimize exposure to UV radiation to reduce your risk of skin cancer. Safety Always wear your seat belt while driving or riding in a vehicle. Do not drive: If you have been drinking alcohol. Do not ride with someone who has been drinking. When you are tired or distracted. While texting. If you have been using any mind-altering substances or drugs. Wear a helmet and other protective equipment during sports activities. If you have firearms in your house, make sure you follow all gun safety procedures. What's next? Visit your health care provider once a year for an annual wellness visit. Ask your health care provider how often you should have your eyes and teeth checked. Stay up to date on all vaccines. This information is not intended to replace advice given to you by your health care provider. Make sure you discuss any questions you have with your health care provider. Document Revised: 11/25/2020 Document Reviewed: 11/25/2020 Elsevier Patient Education  2023 Elsevier Inc.   

## 2022-10-14 NOTE — Assessment & Plan Note (Signed)
Voltaren gel otc Consider sport med/ ortho if no improvement

## 2022-10-14 NOTE — Assessment & Plan Note (Signed)
Ghm utd Check labs  See AVS  Health Maintenance  Topic Date Due   COVID-19 Vaccine (4 - 2023-24 season) 02/11/2022   INFLUENZA VACCINE  09/11/2027 (Originally 01/12/2023)   MAMMOGRAM  01/01/2023   COLONOSCOPY (Pts 45-49yrs Insurance coverage will need to be confirmed)  05/21/2023   Medicare Annual Wellness (AWV)  10/14/2023   DEXA SCAN  01/01/2024   DTaP/Tdap/Td (3 - Td or Tdap) 09/16/2025   Pneumonia Vaccine 26+ Years old  Completed   Hepatitis C Screening  Completed   Zoster Vaccines- Shingrix  Completed   HPV VACCINES  Aged Out

## 2022-10-15 LAB — CBC WITH DIFFERENTIAL/PLATELET
Absolute Monocytes: 578 cells/uL (ref 200–950)
Basophils Absolute: 48 cells/uL (ref 0–200)
Basophils Relative: 0.9 %
Eosinophils Absolute: 191 cells/uL (ref 15–500)
Eosinophils Relative: 3.6 %
HCT: 41.5 % (ref 35.0–45.0)
Hemoglobin: 14 g/dL (ref 11.7–15.5)
Lymphs Abs: 2279 cells/uL (ref 850–3900)
MCH: 30.8 pg (ref 27.0–33.0)
MCHC: 33.7 g/dL (ref 32.0–36.0)
MCV: 91.2 fL (ref 80.0–100.0)
MPV: 10.4 fL (ref 7.5–12.5)
Monocytes Relative: 10.9 %
Neutro Abs: 2205 cells/uL (ref 1500–7800)
Neutrophils Relative %: 41.6 %
Platelets: 227 10*3/uL (ref 140–400)
RBC: 4.55 10*6/uL (ref 3.80–5.10)
RDW: 12 % (ref 11.0–15.0)
Total Lymphocyte: 43 %
WBC: 5.3 10*3/uL (ref 3.8–10.8)

## 2022-10-15 LAB — COMPREHENSIVE METABOLIC PANEL
AG Ratio: 1.4 (calc) (ref 1.0–2.5)
ALT: 21 U/L (ref 6–29)
AST: 23 U/L (ref 10–35)
Albumin: 4.4 g/dL (ref 3.6–5.1)
Alkaline phosphatase (APISO): 79 U/L (ref 37–153)
BUN: 14 mg/dL (ref 7–25)
CO2: 28 mmol/L (ref 20–32)
Calcium: 10.4 mg/dL (ref 8.6–10.4)
Chloride: 103 mmol/L (ref 98–110)
Creat: 0.94 mg/dL (ref 0.50–1.05)
Globulin: 3.1 g/dL (calc) (ref 1.9–3.7)
Glucose, Bld: 95 mg/dL (ref 65–99)
Potassium: 4.6 mmol/L (ref 3.5–5.3)
Sodium: 139 mmol/L (ref 135–146)
Total Bilirubin: 0.8 mg/dL (ref 0.2–1.2)
Total Protein: 7.5 g/dL (ref 6.1–8.1)

## 2022-10-15 LAB — LIPID PANEL
Cholesterol: 206 mg/dL — ABNORMAL HIGH (ref <200)
HDL: 62 mg/dL (ref >=50)
LDL Cholesterol (Calc): 124 mg/dL (calc) — ABNORMAL HIGH
Non-HDL Cholesterol (Calc): 144 mg/dL (calc) — ABNORMAL HIGH (ref <130)
Total CHOL/HDL Ratio: 3.3 (calc) (ref <5.0)
Triglycerides: 102 mg/dL (ref <150)

## 2022-11-25 DIAGNOSIS — R9389 Abnormal findings on diagnostic imaging of other specified body structures: Secondary | ICD-10-CM | POA: Diagnosis not present

## 2023-01-03 DIAGNOSIS — Z1231 Encounter for screening mammogram for malignant neoplasm of breast: Secondary | ICD-10-CM | POA: Diagnosis not present

## 2023-01-03 LAB — HM MAMMOGRAPHY

## 2023-01-04 ENCOUNTER — Encounter: Payer: Self-pay | Admitting: Family Medicine

## 2023-05-19 ENCOUNTER — Other Ambulatory Visit: Payer: Self-pay | Admitting: Obstetrics and Gynecology

## 2023-05-23 ENCOUNTER — Encounter (HOSPITAL_BASED_OUTPATIENT_CLINIC_OR_DEPARTMENT_OTHER): Payer: Self-pay | Admitting: Obstetrics and Gynecology

## 2023-05-23 NOTE — Progress Notes (Signed)
Spoke w/ via phone for pre-op interview--- Elizabeth [name] Lab needs dos----NONE         Lab results------ COVID test -----patient states asymptomatic no test needed Arrive at -------1045 NPO after MN NO Solid Food.  Clear liquids from MN until---0945 Med rec completed Medications to take morning of surgery -----NONE Diabetic medication ----- Patient instructed no nail polish to be worn day of surgery Patient instructed to bring photo id and insurance card day of surgery Patient aware to have Driver (ride ) / caregiver    for 24 hours after surgery - Friend Elizabeth [name] or sister Elizabeth [name] Patient Special Instructions ----- Pre-Op special Instructions ----- Patient verbalized understanding of instructions that were given at this phone interview. Patient denies chest pain, sob, fever, cough at the interview.

## 2023-05-25 NOTE — H&P (Addendum)
Elizabeth [name] is an 67 y.o. female. PMB for diag hs  Pertinent Gynecological History: Menses: post-menopausal Bleeding: post menopausal bleeding Contraception: none DES exposure: denies Blood transfusions: none Sexually transmitted diseases: no past history Previous GYN Procedures: DNC  Last mammogram: normal Date: 2024 Last pap: normal Date: 2024 OB History: G1, P1   Menstrual History: Menarche age: 80 No LMP recorded. Patient is postmenopausal.    Past Medical History:  Diagnosis Date   Acne    Eczema     Past Surgical History:  Procedure Laterality Date   TUBAL LIGATION      Family History  Problem Relation Age of Onset   Breast cancer Sister    Cancer Sister 6       breast   Hepatitis Mother 74       hep C   Heart disease Father 59       MI   Aneurysm Maternal Grandfather    Breast cancer Maternal Aunt    Diabetes Brother    Hyperlipidemia Brother    Hypertension Brother    Dementia Sister 32    Social History:  reports that she has never smoked. She has never used smokeless tobacco. She reports that she does not drink alcohol and does not use drugs.  Allergies:  Allergies  Allergen Reactions   2-(Ethylmercuriothio)Benzoic Acid     Other reaction(s): Other (See Comments) Positive patch test   Hydroxyethyl Methacrylate Rash    Positive patch test   Other Rash    Ethyl cyanoacrylate- Positive patch test Methyl methacrylate- Positive patch test    No medications prior to admission.    Review of Systems  Constitutional: Negative.   All other systems reviewed and are negative.   Height 5' (1.524 m), weight 78.5 kg. Physical Exam Constitutional:      Appearance: Normal appearance.  HENT:     Head: Normocephalic and atraumatic.  Cardiovascular:     Rate and Rhythm: Normal rate and regular rhythm.     Pulses: Normal pulses.     Heart sounds: Normal heart sounds.  Pulmonary:     Effort: Pulmonary effort is normal.     Breath sounds:  Normal breath sounds.  Genitourinary:    General: Normal vulva.  Musculoskeletal:        General: Normal range of motion.     Cervical back: Normal range of motion and neck supple.  Skin:    General: Skin is warm.  Neurological:     General: No focal deficit present.     Mental Status: She is alert.  Psychiatric:        Mood and Affect: Mood normal.     No results found for this or any previous visit (from the past 24 hours).  No results found.  Assessment/Plan: PMP bleeding-recurrent Known fibroids, nl sonohys, nl ebx Diag HS, DNC. Risks of anesthesia, infection, bleeding, injury to surrounding organs with need for repair discussed. Consent done.   Ashrith Sagan J 05/25/2023, 8:36 PM

## 2023-05-26 ENCOUNTER — Ambulatory Visit (HOSPITAL_BASED_OUTPATIENT_CLINIC_OR_DEPARTMENT_OTHER)
Admission: RE | Admit: 2023-05-26 | Discharge: 2023-05-26 | Disposition: A | Discharge status: Home / Self Care | Payer: Medicare Other | Attending: Obstetrics and Gynecology | Admitting: Obstetrics and Gynecology

## 2023-05-26 ENCOUNTER — Ambulatory Visit (HOSPITAL_BASED_OUTPATIENT_CLINIC_OR_DEPARTMENT_OTHER): Payer: Medicare Other | Admitting: Anesthesiology

## 2023-05-26 ENCOUNTER — Other Ambulatory Visit: Payer: Self-pay

## 2023-05-26 ENCOUNTER — Encounter (HOSPITAL_BASED_OUTPATIENT_CLINIC_OR_DEPARTMENT_OTHER)
Admission: RE | Disposition: A | Discharge status: Home / Self Care | Payer: Self-pay | Source: Home / Self Care | Attending: Obstetrics and Gynecology

## 2023-05-26 ENCOUNTER — Encounter (HOSPITAL_BASED_OUTPATIENT_CLINIC_OR_DEPARTMENT_OTHER): Payer: Self-pay | Admitting: Obstetrics and Gynecology

## 2023-05-26 ENCOUNTER — Other Ambulatory Visit (HOSPITAL_COMMUNITY): Payer: Self-pay

## 2023-05-26 DIAGNOSIS — D259 Leiomyoma of uterus, unspecified: Secondary | ICD-10-CM | POA: Insufficient documentation

## 2023-05-26 DIAGNOSIS — Z6834 Body mass index (BMI) 34.0-34.9, adult: Secondary | ICD-10-CM | POA: Insufficient documentation

## 2023-05-26 DIAGNOSIS — Z803 Family history of malignant neoplasm of breast: Secondary | ICD-10-CM | POA: Diagnosis not present

## 2023-05-26 DIAGNOSIS — E669 Obesity, unspecified: Secondary | ICD-10-CM | POA: Insufficient documentation

## 2023-05-26 DIAGNOSIS — R9389 Abnormal findings on diagnostic imaging of other specified body structures: Secondary | ICD-10-CM | POA: Diagnosis not present

## 2023-05-26 DIAGNOSIS — Z833 Family history of diabetes mellitus: Secondary | ICD-10-CM | POA: Insufficient documentation

## 2023-05-26 DIAGNOSIS — N840 Polyp of corpus uteri: Secondary | ICD-10-CM | POA: Diagnosis not present

## 2023-05-26 DIAGNOSIS — N950 Postmenopausal bleeding: Secondary | ICD-10-CM | POA: Diagnosis present

## 2023-05-26 DIAGNOSIS — C541 Malignant neoplasm of endometrium: Secondary | ICD-10-CM | POA: Insufficient documentation

## 2023-05-26 HISTORY — PX: DILATATION & CURETTAGE/HYSTEROSCOPY WITH MYOSURE: SHX6511

## 2023-05-26 LAB — CBC
HCT: 40.1 % (ref 36.0–46.0)
Hemoglobin: 13.7 g/dL (ref 12.0–15.0)
MCH: 31.9 pg (ref 26.0–34.0)
MCHC: 34.2 g/dL (ref 30.0–36.0)
MCV: 93.3 fL (ref 80.0–100.0)
Platelets: 245 10*3/uL (ref 150–400)
RBC: 4.3 MIL/uL (ref 3.87–5.11)
RDW: 12.6 % (ref 11.5–15.5)
WBC: 7.6 10*3/uL (ref 4.0–10.5)
nRBC: 0 % (ref 0.0–0.2)

## 2023-05-26 LAB — TYPE AND SCREEN
ABO/RH(D): A POS
Antibody Screen: NEGATIVE

## 2023-05-26 LAB — ABO/RH: ABO/RH(D): A POS

## 2023-05-26 SURGERY — DILATATION & CURETTAGE/HYSTEROSCOPY WITH MYOSURE
Anesthesia: General | Site: Uterus

## 2023-05-26 MED ORDER — OXYCODONE HCL 5 MG PO TABS
5.0000 mg | ORAL_TABLET | Freq: Once | ORAL | Status: DC | PRN
Start: 2023-05-26 — End: 2023-05-26

## 2023-05-26 MED ORDER — LIDOCAINE HCL (CARDIAC) PF 100 MG/5ML IV SOSY
PREFILLED_SYRINGE | INTRAVENOUS | Status: DC | PRN
  Administered 2023-05-26: 60 mg via INTRAVENOUS

## 2023-05-26 MED ORDER — EPHEDRINE SULFATE (PRESSORS) 50 MG/ML IJ SOLN
INTRAMUSCULAR | Status: DC | PRN
  Administered 2023-05-26: 5 mg via INTRAVENOUS
  Administered 2023-05-26: 10 mg via INTRAVENOUS

## 2023-05-26 MED ORDER — LACTATED RINGERS IV SOLN: INTRAVENOUS | Status: DC

## 2023-05-26 MED ORDER — STERILE WATER FOR IRRIGATION IR SOLN
Status: DC | PRN
  Administered 2023-05-26: 500 mL

## 2023-05-26 MED ORDER — DEXAMETHASONE SODIUM PHOSPHATE 4 MG/ML IJ SOLN
INTRAMUSCULAR | Status: DC | PRN
  Administered 2023-05-26: 5 mg via INTRAVENOUS

## 2023-05-26 MED ORDER — MIDAZOLAM HCL 5 MG/5ML IJ SOLN
INTRAMUSCULAR | Status: DC | PRN
  Administered 2023-05-26: 2 mg via INTRAVENOUS

## 2023-05-26 MED ORDER — SODIUM CHLORIDE 0.9 % IR SOLN
Status: DC | PRN
  Administered 2023-05-26 (×2): 3000 mL

## 2023-05-26 MED ORDER — ONDANSETRON HCL 4 MG/2ML IJ SOLN
INTRAMUSCULAR | Status: AC
  Filled 2023-05-26: qty 2

## 2023-05-26 MED ORDER — VASOPRESSIN 20 UNIT/ML IV SOLN
INTRAVENOUS | Status: DC | PRN
  Administered 2023-05-26: 10 mL

## 2023-05-26 MED ORDER — KETOROLAC TROMETHAMINE 30 MG/ML IJ SOLN
INTRAMUSCULAR | Status: DC | PRN
  Administered 2023-05-26: 30 mg via INTRAVENOUS

## 2023-05-26 MED ORDER — POVIDONE-IODINE 10 % EX SWAB: 2.0000 | Freq: Once | CUTANEOUS | Status: DC

## 2023-05-26 MED ORDER — ONDANSETRON HCL 4 MG/2ML IJ SOLN: 4.0000 mg | Freq: Once | INTRAMUSCULAR | Status: DC | PRN

## 2023-05-26 MED ORDER — PROPOFOL 10 MG/ML IV BOLUS
INTRAVENOUS | Status: DC | PRN
  Administered 2023-05-26: 150 mg via INTRAVENOUS

## 2023-05-26 MED ORDER — OXYCODONE HCL 5 MG PO TABS: 5.0000 mg | ORAL_TABLET | Freq: Four times a day (QID) | ORAL | 0 refills | Status: DC | PRN

## 2023-05-26 MED ORDER — SODIUM CHLORIDE 0.9 % IV SOLN: INTRAVENOUS | Status: DC

## 2023-05-26 MED ORDER — OXYCODONE HCL 5 MG PO TABS
5.0000 mg | ORAL_TABLET | Freq: Four times a day (QID) | ORAL | 0 refills | Status: DC | PRN
  Filled 2023-05-26: qty 15, 4d supply, fill #0

## 2023-05-26 MED ORDER — FENTANYL CITRATE (PF) 100 MCG/2ML IJ SOLN
INTRAMUSCULAR | Status: AC
  Filled 2023-05-26: qty 2

## 2023-05-26 MED ORDER — CEFAZOLIN SODIUM-DEXTROSE 2-4 GM/100ML-% IV SOLN
2.0000 g | INTRAVENOUS | Status: AC
  Administered 2023-05-26: 2 g via INTRAVENOUS

## 2023-05-26 MED ORDER — LIDOCAINE HCL (PF) 2 % IJ SOLN
INTRAMUSCULAR | Status: AC
  Filled 2023-05-26: qty 5

## 2023-05-26 MED ORDER — FENTANYL CITRATE (PF) 100 MCG/2ML IJ SOLN
INTRAMUSCULAR | Status: DC | PRN
  Administered 2023-05-26: 50 ug via INTRAVENOUS
  Administered 2023-05-26 (×2): 25 ug via INTRAVENOUS

## 2023-05-26 MED ORDER — PROPOFOL 10 MG/ML IV BOLUS
INTRAVENOUS | Status: AC
  Filled 2023-05-26: qty 20

## 2023-05-26 MED ORDER — BUPIVACAINE HCL (PF) 0.25 % IJ SOLN
INTRAMUSCULAR | Status: DC | PRN
  Administered 2023-05-26: 20 mL

## 2023-05-26 MED ORDER — ONDANSETRON HCL 4 MG/2ML IJ SOLN
INTRAMUSCULAR | Status: DC | PRN
  Administered 2023-05-26: 4 mg via INTRAVENOUS

## 2023-05-26 MED ORDER — DEXAMETHASONE SODIUM PHOSPHATE 10 MG/ML IJ SOLN
INTRAMUSCULAR | Status: AC
  Filled 2023-05-26: qty 1

## 2023-05-26 MED ORDER — OXYCODONE HCL 5 MG/5ML PO SOLN: 5.0000 mg | Freq: Once | ORAL | Status: DC | PRN

## 2023-05-26 MED ORDER — CEFAZOLIN SODIUM-DEXTROSE 2-4 GM/100ML-% IV SOLN
INTRAVENOUS | Status: AC
  Filled 2023-05-26: qty 100

## 2023-05-26 MED ORDER — ACETAMINOPHEN 500 MG PO TABS
ORAL_TABLET | ORAL | Status: AC
  Filled 2023-05-26: qty 2

## 2023-05-26 MED ORDER — ACETAMINOPHEN 500 MG PO TABS
1000.0000 mg | ORAL_TABLET | Freq: Once | ORAL | Status: AC
  Administered 2023-05-26: 1000 mg via ORAL

## 2023-05-26 MED ORDER — MIDAZOLAM HCL 2 MG/2ML IJ SOLN
INTRAMUSCULAR | Status: AC
  Filled 2023-05-26: qty 2

## 2023-05-26 MED ORDER — EPHEDRINE 5 MG/ML INJ
INTRAVENOUS | Status: AC
  Filled 2023-05-26: qty 5

## 2023-05-26 MED ORDER — FENTANYL CITRATE (PF) 100 MCG/2ML IJ SOLN: 25.0000 ug | INTRAMUSCULAR | Status: DC | PRN

## 2023-05-26 SURGICAL SUPPLY — 20 items
CATH ROBINSON RED A/P 16FR (CATHETERS) ×1 IMPLANT
DEVICE MYOSURE LITE (MISCELLANEOUS) IMPLANT
DEVICE MYOSURE REACH (MISCELLANEOUS) IMPLANT
DRSG TELFA 3X8 NADH STRL (GAUZE/BANDAGES/DRESSINGS) ×1 IMPLANT
GAUZE 4X4 16PLY ~~LOC~~+RFID DBL (SPONGE) ×1 IMPLANT
GLOVE BIO SURGEON STRL SZ7.5 (GLOVE) ×1 IMPLANT
GLOVE BIOGEL PI IND STRL 7.0 (GLOVE) ×1 IMPLANT
GOWN STRL REUS W/TWL LRG LVL3 (GOWN DISPOSABLE) ×2 IMPLANT
IV NS IRRIG 3000ML ARTHROMATIC (IV SOLUTION) ×1 IMPLANT
KIT PROCEDURE FLUENT (KITS) ×1 IMPLANT
KIT TURNOVER CYSTO (KITS) ×1 IMPLANT
NDL SPNL 22GX3.5 QUINCKE BK (NEEDLE) ×1 IMPLANT
NEEDLE SPNL 22GX3.5 QUINCKE BK (NEEDLE) ×1
PACK VAGINAL MINOR WOMEN LF (CUSTOM PROCEDURE TRAY) ×1 IMPLANT
PAD OB MATERNITY 4.3X12.25 (PERSONAL CARE ITEMS) ×1 IMPLANT
SEAL CERVICAL OMNI LOK (ABLATOR) IMPLANT
SEAL ROD LENS SCOPE MYOSURE (ABLATOR) ×1 IMPLANT
SLEEVE SCD COMPRESS KNEE MED (STOCKING) ×1 IMPLANT
SPIKE FLUID TRANSFER (MISCELLANEOUS) ×2 IMPLANT
WATER STERILE IRR 500ML POUR (IV SOLUTION) IMPLANT

## 2023-05-26 NOTE — Anesthesia Postprocedure Evaluation (Signed)
Anesthesia Post Note  Patient: Elizabeth [name]  Procedure(s) Performed: DILATATION & CURETTAGE/HYSTEROSCOPY WITH MYOSURE (Uterus)     Patient location during evaluation: PACU Anesthesia Type: General Level of consciousness: awake and alert Pain management: pain level controlled Vital Signs Assessment: post-procedure vital signs reviewed and stable Respiratory status: spontaneous breathing, nonlabored ventilation and respiratory function stable Cardiovascular status: stable and blood pressure returned to baseline Anesthetic complications: no   No notable events documented.  Last Vitals:  Vitals:   05/26/23 1415 05/26/23 1430  BP: 108/69 114/67  Pulse: 80 77  Resp: 16 13  Temp:    SpO2: 96% 96%    Last Pain:  Vitals:   05/26/23 1430  TempSrc:   PainSc: 0-No pain                 Beryle Lathe

## 2023-05-26 NOTE — Transfer of Care (Signed)
Immediate Anesthesia Transfer of Care Note  Patient: Elizabeth [name]  Procedure(s) Performed: Procedure(s) (LRB): DILATATION & CURETTAGE/HYSTEROSCOPY WITH MYOSURE (N/A)  Patient Location: PACU  Anesthesia Type: GA  Level of Consciousness: awake, sedated, patient cooperative and responds to stimulation  Airway & Oxygen Therapy: Patient Spontanous Breathing and Patient connected to  oxygen  Post-op Assessment: Report given to PACU RN, Post -op Vital signs reviewed and stable and Patient moving all extremities  Post vital signs: Reviewed and stable  Complications: No apparent anesthesia complications

## 2023-05-26 NOTE — Anesthesia Procedure Notes (Signed)
Procedure Name: LMA Insertion Date/Time: 05/26/2023 1:13 PM  Performed by: Jessica Priest, CRNAPre-anesthesia Checklist: Patient identified, Emergency Drugs available, Suction available, Patient being monitored and Timeout performed Patient Re-evaluated:Patient Re-evaluated prior to induction Oxygen Delivery Method: Circle system utilized Preoxygenation: Pre-oxygenation with 100% oxygen Induction Type: IV induction Ventilation: Mask ventilation without difficulty LMA: LMA inserted LMA Size: 4.0 Number of attempts: 1 Airway Equipment and Method: Bite block Placement Confirmation: positive ETCO2, breath sounds checked- equal and bilateral and CO2 detector Tube secured with: Tape Dental Injury: Teeth and Oropharynx as per pre-operative assessment

## 2023-05-26 NOTE — Op Note (Signed)
05/26/2023  1:36 PM  PATIENT:  Elizabeth [name]  67 y.o. female  PRE-OPERATIVE DIAGNOSIS:  Postmenopausal Bleeding, Thickened Endometrium  POST-OPERATIVE DIAGNOSIS:  Postmenopausal Bleeding, Thickened Endometrium  PROCEDURE:  Procedure(s): DILATATION & CURETTAGE/HYSTEROSCOPY WITH MYOSURE  SURGEON:  Surgeon(s): Olivia Mackie, MD  ASSISTANTS: none   ANESTHESIA:   local and general  ESTIMATED BLOOD LOSS: minimal   DRAINS: none   LOCAL MEDICATIONS USED:  MARCAINE     SPECIMEN:  Source of Specimen:  emc and polypoid fragments  DISPOSITION OF SPECIMEN:  PATHOLOGY  COUNTS:  YES  DICTATION #: [id number]  PLAN OF CARE: dc home  PATIENT DISPOSITION:  PACU - hemodynamically stable.

## 2023-05-26 NOTE — Discharge Instructions (Signed)
   No acetaminophen/Tylenol until after 5:10 pm today if needed.  No ibuprofen, Advil, Aleve, Motrin, ketorolac, meloxicam, naproxen, or other NSAIDS until after 7:40 pm today if needed.     Post Anesthesia Home Care Instructions  Activity: Get plenty of rest for the remainder of the day. A responsible individual must stay with you for 24 hours following the procedure.  For the next 24 hours, DO NOT: -Drive a car -Advertising copywriter -Drink alcoholic beverages -Take any medication unless instructed by your physician -Make any legal decisions or sign important papers.  Meals: Start with liquid foods such as gelatin or soup. Progress to regular foods as tolerated. Avoid greasy, spicy, heavy foods. If nausea and/or vomiting occur, drink only clear liquids until the nausea and/or vomiting subsides. Call your physician if vomiting continues.  Special Instructions/Symptoms: Your throat may feel dry or sore from the anesthesia or the breathing tube placed in your throat during surgery. If this causes discomfort, gargle with warm salt water. The discomfort should disappear within 24 hours.

## 2023-05-26 NOTE — Anesthesia Preprocedure Evaluation (Addendum)
Anesthesia Evaluation  Patient identified by MRN, date of birth, ID band Patient awake    Reviewed: Allergy & Precautions, NPO status , Patient's Chart, lab work & pertinent test results  History of Anesthesia Complications Negative for: history of anesthetic complications  Airway Mallampati: III  TM Distance: >3 FB Neck ROM: Full    Dental  (+) Dental Advisory Given, Teeth Intact   Pulmonary neg pulmonary ROS   Pulmonary exam normal        Cardiovascular negative cardio ROS Normal cardiovascular exam     Neuro/Psych negative neurological ROS  negative psych ROS   GI/Hepatic negative GI ROS, Neg liver ROS,,,  Endo/Other   Obesity   Renal/GU negative Renal ROS     Musculoskeletal negative musculoskeletal ROS (+)    Abdominal   Peds  Hematology negative hematology ROS (+)   Anesthesia Other Findings   Reproductive/Obstetrics                             Anesthesia Physical Anesthesia Plan  ASA: 2  Anesthesia Plan: General   Post-op Pain Management: Tylenol PO (pre-op)*   Induction: Intravenous  PONV Risk Score and Plan: 3 and Treatment may vary due to age or medical condition, Ondansetron and Dexamethasone  Airway Management Planned: LMA  Additional Equipment: None  Intra-op Plan:   Post-operative Plan: Extubation in OR  Informed Consent: I have reviewed the patients History and Physical, chart, labs and discussed the procedure including the risks, benefits and alternatives for the proposed anesthesia with the patient or authorized representative who has indicated his/her understanding and acceptance.     Dental advisory given  Plan Discussed with: CRNA and Anesthesiologist  Anesthesia Plan Comments:        Anesthesia Quick Evaluation

## 2023-05-26 NOTE — Op Note (Unsigned)
[name], [name] MEDICAL RECORD NO: [id number] ACCOUNT NO: [id number] DATE OF BIRTH: [birth year] FACILITY: [facility] LOCATION: [facility] PHYSICIAN: Lenoard Aden, MD  Operative Report   DATE OF PROCEDURE: 05/26/2023  PREOPERATIVE DIAGNOSES:  Postmenopausal bleeding, thickened endometrium on ultrasound, history of normal endometrial biopsy.  POSTOPERATIVE DIAGNOSES:  Anterior wall and endometrial polyp incompletely resected  PROCEDURE:  Diagnostic hysteroscopy, D and C, MyoSure resection, incomplete endometrial polyp due to technical malfunction, D&C.  SURGEON:  Lenoard Aden, MD  ASSISTANT:  None.  ANESTHESIA:  Local, general.  ESTIMATED BLOOD LOSS:  Less than 50 mL.  FLUID DEFICIT:  600 mL.  COMPLICATIONS:  None.  DRAINS:  None.  COUNTS:  Correct.  DISPOSITION:  The patient to recovery in good condition.  SPECIMENS:  EMC and polypoid fragments to pathology  BRIEF OPERATIVE NOTE:  After being appraised of the risks of anesthesia, infection, bleeding, injury to surrounding organs, possible need for repair, delayed versus immediate complications, including bowel and bladder injury, possible need for repair.   The patient was brought to the operating room and was administered general anesthetic without complications and was prepped and draped in the usual sterile fashion.  Catheterized until the bladder was empty.  Exam under anesthesia revealed 16-week sized  uterus.  No adnexal mass appreciated.  Multiple uterine fibroids noted at this time.  Dilute Pitressin solution was placed 3 at 9 o'clock, 18 mL total.  Dilute paracervical block 20 mL total.  Cervix easily dilated up to a 23 Pratt dilator, hysteroscope  placed.  Visualization due to an expanded large endometrial cavity is hard to visualize technically, but after further observation, there appears to be a polypoid fragment extending in the right anterior fundal area.  The MyoSure reach device was entered. Resection  of the polyp is done down to as close to the base as possible; however, due to technical malfunction of the hysteroscope, which could not be remedied during the procedure, complete visualization of the base was impossible at this time.   The polyp was felt to be approximately 80% to 90% resected to the base.  A 500 fluid deficit was noted.  Otherwise, normal endometrial cavity was appreciated.  D&C was performed using sharp curettage in a 4-quadrant method.  All instruments were removed.   The patient tolerated the procedure well and was awakened and transferred to recovery in good condition.   PUS D: 05/26/2023 1:40:13 pm T: 05/26/2023 2:38:00 pm  JOB: [id number]/ [id number]

## 2023-05-27 ENCOUNTER — Encounter (HOSPITAL_BASED_OUTPATIENT_CLINIC_OR_DEPARTMENT_OTHER): Payer: Self-pay | Admitting: Obstetrics and Gynecology

## 2023-05-31 LAB — SURGICAL PATHOLOGY

## 2023-06-08 ENCOUNTER — Encounter: Payer: Self-pay | Admitting: Gynecologic Oncology

## 2023-06-08 NOTE — H&P (View-Only) (Signed)
 GYNECOLOGIC ONCOLOGY NEW PATIENT CONSULTATION   Patient Name: Elizabeth [name]  Patient Age: 67 y.o. Date of Service: 06/09/23 Referring Provider: Dr. 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   We reviewed the nature of endometrial cancer and its recommended surgical staging, including total hysterectomy, bilateral salpingo-oophorectomy, and lymph node assessment. The patient is a suitable candidate for staging via a minimally invasive approach to surgery.  We reviewed that robotic assistance would be used to complete the surgery.   We discussed that most endometrial cancer is detected early and that decisions regarding adjuvant therapy will be made based on her final pathology.   We reviewed the sentinel lymph node technique. Risks and benefits of sentinel lymph node biopsy was reviewed. We reviewed the technique and ICG dye. The patient DOES NOT have an iodine allergy or known liver dysfunction. We reviewed the false negative rate (0.4%), and that 3% of patients with metastatic disease will not have it detected by SLN biopsy in endometrial cancer. A low risk of allergic reaction to the dye, <0.2% for ICG, has been reported. We also discussed that in the case of failed mapping, which occurs 40% of the time, a bilateral or unilateral lymphadenectomy will be performed at the surgeon's discretion.   Potential benefits of sentinel nodes including a higher detection rate for metastasis due to ultrastaging and potential reduction in operative morbidity. However, there remains uncertainty as to the role for treatment of micrometastatic disease. Further, the benefit of operative morbidity associated with the SLN technique in endometrial cancer is not yet completely known. In other patient populations (e.g. the cervical cancer population) there  has been observed reductions in morbidity with SLN biopsy compared to pelvic lymphadenectomy. Lymphedema, nerve dysfunction and lymphocysts are all potential risks with the SLN technique as with complete lymphadenectomy. Additional risks to the patient include the risk of damage to an internal organ while operating in an altered view (e.g. the black and white image of the robotic fluorescence imaging mode).   We discussed the plan for a robotic assisted hysterectomy, bilateral salpingo-oophorectomy, sentinel lymph node evaluation, possible lymph node dissection, possible laparotomy. The risks of surgery were discussed in detail and she understands these to include infection; wound separation; hernia; vaginal cuff separation, injury to adjacent organs such as bowel, bladder, blood vessels, ureters and nerves; bleeding which may require blood transfusion; anesthesia risk; thromboembolic events; possible death; unforeseen complications; possible need for re-exploration; medical complications such as heart attack, stroke, pleural effusion and pneumonia; and, if full lymphadenectomy is performed the risk of lymphedema and lymphocyst. The patient will receive DVT and antibiotic prophylaxis as indicated. She voiced a clear understanding. She had the opportunity to ask questions. Perioperative instructions were reviewed with her. Prescriptions for post-op medications were sent to her pharmacy of choice.  We discussed MMR results from her biopsy and additional testing that will be performed.  Given couple weeks of ongoing cough, encouraged her to reach out to primary care provider for an appointment.  Also encouraged COVID and flu testing.  A copy of this note was sent to the patient's referring provider.   65 minutes of total time was spent for this patient encounter, including preparation, face-to-face counseling with the patient and coordination of care, and documentation of the encounter.  Eugene Garnet,  MD  Division of Gynecologic Oncology  Department of Obstetrics and Gynecology  [facility]  of [facility]  ___________________________________________  Chief Complaint: Chief Complaint  Patient presents with   Endometrial cancer [facility])    History of Present Illness:  Elizabeth [name] is a 67 y.o. y.o. female who is seen in consultation at the request of Dr. Billy Coast for an evaluation of endometrial cancer.  Work-up to date: Endometrial biopsy performed on 04/01/2021 reveals rare strips of atrophic endometrium and mucus, extremely scant.  Hyperplasia or malignancy noted. Pelvic ultrasound exam on 11/25/2022 at [facility] shows a thickened endometrium measuring 3.3 mm.  No discrete lesions seen on sonohysterography. Endometrial biopsy performed at the time of the above ultrasound on 6/14 reveals minute fragments of benign endocervical glands, no diagnostic endometrial tissue. Repeat pelvic ultrasound exam on 01/19/2023 reveals a uterus measuring 13.3 x 10.1 x 4.3 cm endometrium measures 8.1 mm, trace free fluid noted in the endometrium.  Multiple fibroids noted, the largest measures up to 5.7 cm.  Ovaries within normal limits. Hysteroscopy with endometrial sampling, incomplete resection of endometrial polyp on 05/26/2023. 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 P53 WT.  The patient presents today with her daughter, who is visiting from [place].  She endorses having postmenopausal bleeding that started over the summer that was intermittent and light pink, mostly just when she wiped.  Starting in November, bleeding became heavier some days, still light other days.  On her heavier days, she describes using 1 pad that is mostly saturated.  She denies any associated cramping, pain, or passage of clots.  She endorses a good appetite without nausea or emesis.  She reports normal bowel and bladder function.  She developed a cough  either right before right after her recent procedure.  Denies other symptoms.  PAST MEDICAL HISTORY:  Past Medical History:  Diagnosis Date   Acne    Eczema      PAST SURGICAL HISTORY:  Past Surgical History:  Procedure Laterality Date   DILATATION & CURETTAGE/HYSTEROSCOPY WITH MYOSURE N/A 05/26/2023   Procedure: DILATATION & CURETTAGE/HYSTEROSCOPY WITH MYOSURE;  Surgeon: Olivia Mackie, MD;  Location: [facility];  Service: Gynecology;  Laterality: N/A;   TUBAL LIGATION      OB/GYN HISTORY:  OB History  Gravida Para Term Preterm AB Living  3 1      SAB IAB Ectopic Multiple Live Births          # Outcome Date GA Lbr Len/2nd Weight Sex Type Anes PTL Lv  3 Gravida           2 Gravida           1 Para             No LMP recorded. Patient is postmenopausal.  Age at menarche: 35  Age at menopause: 44 Hx of HRT: denies Hx of STDs: denies Last pap: 2024 History of abnormal pap smears: yes, ASCUS pap 2008 and LSIL pap in 2011 per OBGYN notes.  On discussion with the patient today.  She vaguely remembers having some abnormal Pap smears previously that were followed closely with Pap smears.  Does not remember having any cervical biopsies or procedures on her cervix.  SCREENING STUDIES:  Last mammogram: 2024  Last colonoscopy: 2019 (per patient) Last bone mineral density: 2023  MEDICATIONS: Outpatient Encounter Medications as of 06/09/2023  Medication Sig   Ascorbic Acid (VITAMIN C) 1000 MG tablet Take 1,000 mg by mouth daily.   cholecalciferol (VITAMIN D3) 25 MCG (1000 UNIT) tablet  Take by mouth daily. Patient takes 2,000 international units daily   Multiple Vitamin (MULTIVITAMIN WITH MINERALS) TABS tablet Take 1 tablet by mouth daily.   norethindrone (GALLIFREY) 5 MG tablet Take 5 mg by mouth daily.   oxyCODONE (OXY IR/ROXICODONE) 5 MG immediate release tablet Take 1 tablet (5 mg total) by mouth every 6 (six) hours as needed for severe pain (pain score 7-10).  (Patient not taking: Reported on 06/08/2023)   oxyCODONE (OXY IR/ROXICODONE) 5 MG immediate release tablet Take 1 tablet (5 mg total) by mouth every 6 (six) hours as needed for severe pain. (pain score 7-10) (Patient not taking: Reported on 06/08/2023)   No facility-administered encounter medications on file as of 06/09/2023.    ALLERGIES:  Allergies  Allergen Reactions   2-(Ethylmercuriothio)Benzoic Acid     Other reaction(s): Other (See Comments) Positive patch test   Hydroxyethyl Methacrylate Rash    Positive patch test   Other Rash    Ethyl cyanoacrylate- Positive patch test Methyl methacrylate- Positive patch test     FAMILY HISTORY:  Family History  Problem Relation Age of Onset   Hepatitis Mother 88       hep C   Heart disease Father 38       MI   Breast cancer Sister    Cancer Sister 65       breast   Dementia Sister 71   Diabetes Brother    Hyperlipidemia Brother    Hypertension Brother    Aneurysm Maternal Grandfather    Breast cancer Maternal Aunt    Breast cancer Maternal Aunt    Colon cancer Neg Hx    Ovarian cancer Neg Hx    Endometrial cancer Neg Hx    Pancreatic cancer Neg Hx    Prostate cancer Neg Hx      SOCIAL HISTORY:  Social Connections: Not on file    REVIEW OF SYSTEMS:  + Cough, vaginal bleeding, rash Denies appetite changes, fevers, chills, fatigue, unexplained weight changes. Denies hearing loss, neck lumps or masses, mouth sores, ringing in ears or voice changes. Denies wheezing.  Denies shortness of breath. Denies chest pain or palpitations. Denies leg swelling. Denies abdominal distention, pain, blood in stools, constipation, diarrhea, nausea, vomiting, or early satiety. Denies pain with intercourse, dysuria, frequency, hematuria or incontinence. Denies hot flashes, pelvic pain or vaginal discharge.   Denies joint pain, back pain or muscle pain/cramps. Denies itching, rash, or wounds. Denies dizziness, headaches, numbness or  seizures. Denies swollen lymph nodes or glands, denies easy bruising or bleeding. Denies anxiety, depression, confusion, or decreased concentration.  Physical Exam:  Vital Signs for this encounter:  Blood pressure 118/69, pulse 84, temperature 98 F (36.7 C), temperature source Temporal, resp. rate 18, height 5' (1.524 m), weight 175 lb 12.8 oz (79.7 kg), SpO2 98%. Body mass index is 34.33 kg/m. General: Alert, oriented, no acute distress.  HEENT: Normocephalic, atraumatic. Sclera anicteric.  Chest: Clear to auscultation bilaterally. No wheezes, rhonchi, or rales. Cardiovascular: Regular rate and rhythm, no murmurs, rubs, or gallops.  Abdomen: Obese. Normoactive bowel sounds. Soft, nondistended, nontender to palpation. No masses or hepatosplenomegaly appreciated. No palpable fluid wave.  Extremities: Grossly normal range of motion. Warm, well perfused. No edema bilaterally.  Skin: No rashes or lesions.  Lymphatics: No cervical, supraclavicular, or inguinal adenopathy.  GU:  Normal external female genitalia. No lesions. No discharge or bleeding.             Bladder/urethra:  No lesions or masses, well  supported bladder             Vagina: Minimal blood within the vault, vaginal mucosa normal in appearance.             Cervix: Normal appearing, no lesions.             Uterus: 8-10 cm, mobile, no parametrial involvement or nodularity.             Adnexa: No masses appreciated.  Rectal: Deferred.  LABORATORY AND RADIOLOGIC DATA:  Outside medical records were reviewed to synthesize the above history, along with the history and physical obtained during the visit.   Lab Results  Component Value Date   WBC 7.6 05/26/2023   HGB 13.7 05/26/2023   HCT 40.1 05/26/2023   PLT 245 05/26/2023   GLUCOSE 95 10/14/2022   CHOL 206 (H) 10/14/2022   TRIG 102 10/14/2022   HDL 62 10/14/2022   LDLDIRECT 140.8 04/10/2012   LDLCALC 124 (H) 10/14/2022   ALT 21 10/14/2022   AST 23 10/14/2022   NA 139  10/14/2022   K 4.6 10/14/2022   CL 103 10/14/2022   CREATININE 0.94 10/14/2022   BUN 14 10/14/2022   CO2 28 10/14/2022   TSH 0.81 08/09/2021

## 2023-06-08 NOTE — Progress Notes (Signed)
GYNECOLOGIC ONCOLOGY NEW PATIENT CONSULTATION   Patient Name: Elizabeth [name]  Patient Age: 67 y.o. Date of Service: 06/09/23 Referring Provider: Dr. 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   We reviewed the nature of endometrial cancer and its recommended surgical staging, including total hysterectomy, bilateral salpingo-oophorectomy, and lymph node assessment. The patient is a suitable candidate for staging via a minimally invasive approach to surgery.  We reviewed that robotic assistance would be used to complete the surgery.   We discussed that most endometrial cancer is detected early and that decisions regarding adjuvant therapy will be made based on her final pathology.   We reviewed the sentinel lymph node technique. Risks and benefits of sentinel lymph node biopsy was reviewed. We reviewed the technique and ICG dye. The patient DOES NOT have an iodine allergy or known liver dysfunction. We reviewed the false negative rate (0.4%), and that 3% of patients with metastatic disease will not have it detected by SLN biopsy in endometrial cancer. A low risk of allergic reaction to the dye, <0.2% for ICG, has been reported. We also discussed that in the case of failed mapping, which occurs 40% of the time, a bilateral or unilateral lymphadenectomy will be performed at the surgeon's discretion.   Potential benefits of sentinel nodes including a higher detection rate for metastasis due to ultrastaging and potential reduction in operative morbidity. However, there remains uncertainty as to the role for treatment of micrometastatic disease. Further, the benefit of operative morbidity associated with the SLN technique in endometrial cancer is not yet completely known. In other patient populations (e.g. the cervical cancer population) there  has been observed reductions in morbidity with SLN biopsy compared to pelvic lymphadenectomy. Lymphedema, nerve dysfunction and lymphocysts are all potential risks with the SLN technique as with complete lymphadenectomy. Additional risks to the patient include the risk of damage to an internal organ while operating in an altered view (e.g. the black and white image of the robotic fluorescence imaging mode).   We discussed the plan for a robotic assisted hysterectomy, bilateral salpingo-oophorectomy, sentinel lymph node evaluation, possible lymph node dissection, possible laparotomy. The risks of surgery were discussed in detail and she understands these to include infection; wound separation; hernia; vaginal cuff separation, injury to adjacent organs such as bowel, bladder, blood vessels, ureters and nerves; bleeding which may require blood transfusion; anesthesia risk; thromboembolic events; possible death; unforeseen complications; possible need for re-exploration; medical complications such as heart attack, stroke, pleural effusion and pneumonia; and, if full lymphadenectomy is performed the risk of lymphedema and lymphocyst. The patient will receive DVT and antibiotic prophylaxis as indicated. She voiced a clear understanding. She had the opportunity to ask questions. Perioperative instructions were reviewed with her. Prescriptions for post-op medications were sent to her pharmacy of choice.  We discussed MMR results from her biopsy and additional testing that will be performed.  Given couple weeks of ongoing cough, encouraged her to reach out to primary care provider for an appointment.  Also encouraged COVID and flu testing.  A copy of this note was sent to the patient's referring provider.   65 minutes of total time was spent for this patient encounter, including preparation, face-to-face counseling with the patient and coordination of care, and documentation of the encounter.  Eugene Garnet,  MD  Division of Gynecologic Oncology  Department of Obstetrics and Gynecology  [facility]  of [facility]  ___________________________________________  Chief Complaint: Chief Complaint  Patient presents with   Endometrial cancer [facility])    History of Present Illness:  Elizabeth [name] is a 67 y.o. y.o. female who is seen in consultation at the request of Dr. Billy Coast for an evaluation of endometrial cancer.  Work-up to date: Endometrial biopsy performed on 04/01/2021 reveals rare strips of atrophic endometrium and mucus, extremely scant.  Hyperplasia or malignancy noted. Pelvic ultrasound exam on 11/25/2022 at [facility] shows a thickened endometrium measuring 3.3 mm.  No discrete lesions seen on sonohysterography. Endometrial biopsy performed at the time of the above ultrasound on 6/14 reveals minute fragments of benign endocervical glands, no diagnostic endometrial tissue. Repeat pelvic ultrasound exam on 01/19/2023 reveals a uterus measuring 13.3 x 10.1 x 4.3 cm endometrium measures 8.1 mm, trace free fluid noted in the endometrium.  Multiple fibroids noted, the largest measures up to 5.7 cm.  Ovaries within normal limits. Hysteroscopy with endometrial sampling, incomplete resection of endometrial polyp on 05/26/2023. 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 P53 WT.  The patient presents today with her daughter, who is visiting from [place].  She endorses having postmenopausal bleeding that started over the summer that was intermittent and light pink, mostly just when she wiped.  Starting in November, bleeding became heavier some days, still light other days.  On her heavier days, she describes using 1 pad that is mostly saturated.  She denies any associated cramping, pain, or passage of clots.  She endorses a good appetite without nausea or emesis.  She reports normal bowel and bladder function.  She developed a cough  either right before right after her recent procedure.  Denies other symptoms.  PAST MEDICAL HISTORY:  Past Medical History:  Diagnosis Date   Acne    Eczema      PAST SURGICAL HISTORY:  Past Surgical History:  Procedure Laterality Date   DILATATION & CURETTAGE/HYSTEROSCOPY WITH MYOSURE N/A 05/26/2023   Procedure: DILATATION & CURETTAGE/HYSTEROSCOPY WITH MYOSURE;  Surgeon: Olivia Mackie, MD;  Location: [facility];  Service: Gynecology;  Laterality: N/A;   TUBAL LIGATION      OB/GYN HISTORY:  OB History  Gravida Para Term Preterm AB Living  3 1      SAB IAB Ectopic Multiple Live Births          # Outcome Date GA Lbr Len/2nd Weight Sex Type Anes PTL Lv  3 Gravida           2 Gravida           1 Para             No LMP recorded. Patient is postmenopausal.  Age at menarche: 35  Age at menopause: 44 Hx of HRT: denies Hx of STDs: denies Last pap: 2024 History of abnormal pap smears: yes, ASCUS pap 2008 and LSIL pap in 2011 per OBGYN notes.  On discussion with the patient today.  She vaguely remembers having some abnormal Pap smears previously that were followed closely with Pap smears.  Does not remember having any cervical biopsies or procedures on her cervix.  SCREENING STUDIES:  Last mammogram: 2024  Last colonoscopy: 2019 (per patient) Last bone mineral density: 2023  MEDICATIONS: Outpatient Encounter Medications as of 06/09/2023  Medication Sig   Ascorbic Acid (VITAMIN C) 1000 MG tablet Take 1,000 mg by mouth daily.   cholecalciferol (VITAMIN D3) 25 MCG (1000 UNIT) tablet  Take by mouth daily. Patient takes 2,000 international units daily   Multiple Vitamin (MULTIVITAMIN WITH MINERALS) TABS tablet Take 1 tablet by mouth daily.   norethindrone (GALLIFREY) 5 MG tablet Take 5 mg by mouth daily.   oxyCODONE (OXY IR/ROXICODONE) 5 MG immediate release tablet Take 1 tablet (5 mg total) by mouth every 6 (six) hours as needed for severe pain (pain score 7-10).  (Patient not taking: Reported on 06/08/2023)   oxyCODONE (OXY IR/ROXICODONE) 5 MG immediate release tablet Take 1 tablet (5 mg total) by mouth every 6 (six) hours as needed for severe pain. (pain score 7-10) (Patient not taking: Reported on 06/08/2023)   No facility-administered encounter medications on file as of 06/09/2023.    ALLERGIES:  Allergies  Allergen Reactions   2-(Ethylmercuriothio)Benzoic Acid     Other reaction(s): Other (See Comments) Positive patch test   Hydroxyethyl Methacrylate Rash    Positive patch test   Other Rash    Ethyl cyanoacrylate- Positive patch test Methyl methacrylate- Positive patch test     FAMILY HISTORY:  Family History  Problem Relation Age of Onset   Hepatitis Mother 88       hep C   Heart disease Father 38       MI   Breast cancer Sister    Cancer Sister 65       breast   Dementia Sister 71   Diabetes Brother    Hyperlipidemia Brother    Hypertension Brother    Aneurysm Maternal Grandfather    Breast cancer Maternal Aunt    Breast cancer Maternal Aunt    Colon cancer Neg Hx    Ovarian cancer Neg Hx    Endometrial cancer Neg Hx    Pancreatic cancer Neg Hx    Prostate cancer Neg Hx      SOCIAL HISTORY:  Social Connections: Not on file    REVIEW OF SYSTEMS:  + Cough, vaginal bleeding, rash Denies appetite changes, fevers, chills, fatigue, unexplained weight changes. Denies hearing loss, neck lumps or masses, mouth sores, ringing in ears or voice changes. Denies wheezing.  Denies shortness of breath. Denies chest pain or palpitations. Denies leg swelling. Denies abdominal distention, pain, blood in stools, constipation, diarrhea, nausea, vomiting, or early satiety. Denies pain with intercourse, dysuria, frequency, hematuria or incontinence. Denies hot flashes, pelvic pain or vaginal discharge.   Denies joint pain, back pain or muscle pain/cramps. Denies itching, rash, or wounds. Denies dizziness, headaches, numbness or  seizures. Denies swollen lymph nodes or glands, denies easy bruising or bleeding. Denies anxiety, depression, confusion, or decreased concentration.  Physical Exam:  Vital Signs for this encounter:  Blood pressure 118/69, pulse 84, temperature 98 F (36.7 C), temperature source Temporal, resp. rate 18, height 5' (1.524 m), weight 175 lb 12.8 oz (79.7 kg), SpO2 98%. Body mass index is 34.33 kg/m. General: Alert, oriented, no acute distress.  HEENT: Normocephalic, atraumatic. Sclera anicteric.  Chest: Clear to auscultation bilaterally. No wheezes, rhonchi, or rales. Cardiovascular: Regular rate and rhythm, no murmurs, rubs, or gallops.  Abdomen: Obese. Normoactive bowel sounds. Soft, nondistended, nontender to palpation. No masses or hepatosplenomegaly appreciated. No palpable fluid wave.  Extremities: Grossly normal range of motion. Warm, well perfused. No edema bilaterally.  Skin: No rashes or lesions.  Lymphatics: No cervical, supraclavicular, or inguinal adenopathy.  GU:  Normal external female genitalia. No lesions. No discharge or bleeding.             Bladder/urethra:  No lesions or masses, well  supported bladder             Vagina: Minimal blood within the vault, vaginal mucosa normal in appearance.             Cervix: Normal appearing, no lesions.             Uterus: 8-10 cm, mobile, no parametrial involvement or nodularity.             Adnexa: No masses appreciated.  Rectal: Deferred.  LABORATORY AND RADIOLOGIC DATA:  Outside medical records were reviewed to synthesize the above history, along with the history and physical obtained during the visit.   Lab Results  Component Value Date   WBC 7.6 05/26/2023   HGB 13.7 05/26/2023   HCT 40.1 05/26/2023   PLT 245 05/26/2023   GLUCOSE 95 10/14/2022   CHOL 206 (H) 10/14/2022   TRIG 102 10/14/2022   HDL 62 10/14/2022   LDLDIRECT 140.8 04/10/2012   LDLCALC 124 (H) 10/14/2022   ALT 21 10/14/2022   AST 23 10/14/2022   NA 139  10/14/2022   K 4.6 10/14/2022   CL 103 10/14/2022   CREATININE 0.94 10/14/2022   BUN 14 10/14/2022   CO2 28 10/14/2022   TSH 0.81 08/09/2021

## 2023-06-09 ENCOUNTER — Encounter: Payer: Self-pay | Admitting: Gynecologic Oncology

## 2023-06-09 ENCOUNTER — Inpatient Hospital Stay: Payer: Medicare Other | Attending: Gynecologic Oncology | Admitting: Gynecologic Oncology

## 2023-06-09 ENCOUNTER — Inpatient Hospital Stay (HOSPITAL_BASED_OUTPATIENT_CLINIC_OR_DEPARTMENT_OTHER): Payer: Medicare Other | Admitting: Gynecologic Oncology

## 2023-06-09 ENCOUNTER — Ambulatory Visit: Payer: Medicare Other | Admitting: Family Medicine

## 2023-06-09 ENCOUNTER — Telehealth: Payer: Self-pay | Admitting: *Deleted

## 2023-06-09 VITALS — BP 118/69 | HR 84 | Temp 98.0°F | Resp 18 | Ht 60.0 in | Wt 175.8 lb

## 2023-06-09 DIAGNOSIS — C541 Malignant neoplasm of endometrium: Secondary | ICD-10-CM | POA: Diagnosis not present

## 2023-06-09 DIAGNOSIS — E669 Obesity, unspecified: Secondary | ICD-10-CM

## 2023-06-09 DIAGNOSIS — D259 Leiomyoma of uterus, unspecified: Secondary | ICD-10-CM | POA: Diagnosis not present

## 2023-06-09 DIAGNOSIS — N950 Postmenopausal bleeding: Secondary | ICD-10-CM | POA: Diagnosis not present

## 2023-06-09 DIAGNOSIS — Z803 Family history of malignant neoplasm of breast: Secondary | ICD-10-CM | POA: Diagnosis not present

## 2023-06-09 NOTE — Patient Instructions (Signed)
Please get in with your primary care provider for further workup of your cough before surgery to prevent delays/cancellation with anesthesia.   Preparing for your Surgery   Plan for surgery on July 05, 2023 with Dr. Eugene Garnet at [facility]. You will be scheduled for robotic assisted total laparoscopic hysterectomy (removal of the uterus and cervix), bilateral salpingo-oophorectomy (removal of both ovaries and fallopian tubes), sentinel lymph node biopsy, possible lymph node dissection, possible laparotomy (larger incision on your abdomen if needed).    Pre-operative Testing -You will receive a phone call from presurgical testing at [facility] to arrange for a pre-operative appointment and lab work.   -Bring your insurance card, copy of an advanced directive if applicable, medication list   -At that visit, you will be asked to sign a consent for a possible blood transfusion in case a transfusion becomes necessary during surgery.  The need for a blood transfusion is rare but having consent is a necessary part of your care.      -You should not be taking blood thinners or aspirin at least ten days prior to surgery unless instructed by your surgeon.   -Do not take supplements such as fish oil (omega 3), red yeast rice, turmeric before your surgery. STOP TAKING AT LEAST 10 DAYS BEFORE SURGERY. You want to avoid medications with aspirin in them including headache powders such as BC or Goody's), Excedrin migraine.   Day Before Surgery at Home -You will be asked to take in a light diet the day before surgery. You will be advised you can have clear liquids up until 3 hours before your surgery.     Eat a light diet the day before surgery.  Examples including soups, broths, toast, yogurt, mashed potatoes.  AVOID GAS PRODUCING FOODS AND BEVERAGES. Things to avoid include carbonated beverages (fizzy beverages, sodas), raw fruits and raw vegetables (uncooked), or beans.    If  your bowels are filled with gas, your surgeon will have difficulty visualizing your pelvic organs which increases your surgical risks.   Your role in recovery Your role is to become active as soon as directed by your doctor, while still giving yourself time to heal.  Rest when you feel tired. You will be asked to do the following in order to speed your recovery:   - Cough and breathe deeply. This helps to clear and expand your lungs and can prevent pneumonia after surgery.  - STAY ACTIVE WHEN YOU GET HOME. Do mild physical activity. Walking or moving your legs help your circulation and body functions return to normal. Do not try to get up or walk alone the first time after surgery.   -If you develop swelling on one leg or the other, pain in the back of your leg, redness/warmth in one of your legs, please call the office or go to the Emergency Room to have a doppler to rule out a blood clot. For shortness of breath, chest pain-seek care in the Emergency Room as soon as possible. - Actively manage your pain. Managing your pain lets you move in comfort. We will ask you to rate your pain on a scale of zero to 10. It is your responsibility to tell your doctor or nurse where and how much you hurt so your pain can be treated.   Special Considerations -If you are diabetic, you may be placed on insulin after surgery to have closer control over your blood sugars to promote healing and recovery.  This  does not mean that you will be discharged on insulin.  If applicable, your oral antidiabetics will be resumed when you are tolerating a solid diet.   -Your final pathology results from surgery should be available around one week after surgery and the results will be relayed to you when available.   -FMLA forms can be faxed to [phone] and please allow 5-7 business days for completion.   Pain Management After Surgery -You will be prescribed your pain medication and bowel regimen medications before surgery  closer to the date so that you can have these available when you are discharged from the hospital. The pain medication is for use ONLY AFTER surgery and a new prescription will not be given.    -Make sure that you have Tylenol and Ibuprofen IF YOU ARE ABLE TO TAKE THESE MEDICATIONS at home to use on a regular basis after surgery for pain control. We recommend alternating the medications every hour to six hours since they work differently and are processed in the body differently for pain relief.   -Review the attached handout on narcotic use and their risks and side effects.    Bowel Regimen -You will be prescribed Sennakot-S to take nightly to prevent constipation especially if you are taking the narcotic pain medication intermittently.  It is important to prevent constipation and drink adequate amounts of liquids. You can stop taking this medication when you are not taking pain medication and you are back on your normal bowel routine.   Risks of Surgery Risks of surgery are low but include bleeding, infection, damage to surrounding structures, re-operation, blood clots, and very rarely death.     Blood Transfusion Information (For the consent to be signed before surgery)   We will be checking your blood type before surgery so in case of emergencies, we will know what type of blood you would need.                                             WHAT IS A BLOOD TRANSFUSION?   A transfusion is the replacement of blood or some of its parts. Blood is made up of multiple cells which provide different functions. Red blood cells carry oxygen and are used for blood loss replacement. White blood cells fight against infection. Platelets control bleeding. Plasma helps clot blood. Other blood products are available for specialized needs, such as hemophilia or other clotting disorders. BEFORE THE TRANSFUSION  Who gives blood for transfusions?  You may be able to donate blood to be used at a later date on  yourself (autologous donation). Relatives can be asked to donate blood. This is generally not any safer than if you have received blood from a stranger. The same precautions are taken to ensure safety when a relative's blood is donated. Healthy volunteers who are fully evaluated to make sure their blood is safe. This is blood bank blood. Transfusion therapy is the safest it has ever been in the practice of medicine. Before blood is taken from a donor, a complete history is taken to make sure that person has no history of diseases nor engages in risky social behavior (examples are intravenous drug use or sexual activity with multiple partners). The donor's travel history is screened to minimize risk of transmitting infections, such as malaria. The donated blood is tested for signs of infectious diseases, such as HIV  and hepatitis. The blood is then tested to be sure it is compatible with you in order to minimize the chance of a transfusion reaction. If you or a relative donates blood, this is often done in anticipation of surgery and is not appropriate for emergency situations. It takes many days to process the donated blood. RISKS AND COMPLICATIONS Although transfusion therapy is very safe and saves many lives, the main dangers of transfusion include:  Getting an infectious disease. Developing a transfusion reaction. This is an allergic reaction to something in the blood you were given. Every precaution is taken to prevent this. The decision to have a blood transfusion has been considered carefully by your caregiver before blood is given. Blood is not given unless the benefits outweigh the risks.   AFTER SURGERY INSTRUCTIONS   Return to work: 4-6 weeks if applicable   Activity: 1. Be up and out of the bed during the day.  Take a nap if needed.  You may walk up steps but be careful and use the hand rail.  Stair climbing will tire you more than you think, you may need to stop part way and rest.    2.  No lifting or straining for 6 weeks over 10 pounds. No pushing, pulling, straining for 6 weeks.   3. No driving for 8-65 days when the following criteria have been met: Do not drive if you are taking narcotic pain medicine and make sure that your reaction time has returned.    4. You can shower as soon as the next day after surgery. Shower daily.  Use your regular soap and water (not directly on the incision) and pat your incision(s) dry afterwards; don't rub.  No tub baths or submerging your body in water until cleared by your surgeon. If you have the soap that was given to you by pre-surgical testing that was used before surgery, you do not need to use it afterwards because this can irritate your incisions.    5. No sexual activity and nothing in the vagina for 10-12 weeks.   6. You may experience a small amount of clear drainage from your incisions, which is normal.  If the drainage persists, increases, or changes color please call the office.   7. Do not use creams, lotions, or ointments such as neosporin on your incisions after surgery until advised by your surgeon because they can cause removal of the dermabond glue on your incisions.     8. You may experience vaginal spotting after surgery or when the stitches at the top of the vagina begin to dissolve.  The spotting is normal but if you experience heavy bleeding, call our office.   9. Take Tylenol or ibuprofen first for pain if you are able to take these medications and only use narcotic pain medication for severe pain not relieved by the Tylenol or Ibuprofen.  Monitor your Tylenol intake to a max of 4,000 mg in a 24 hour period. You can alternate these medications after surgery.   Diet: 1. Low sodium Heart Healthy Diet is recommended but you are cleared to resume your normal (before surgery) diet after your procedure.   2. It is safe to use a laxative, such as Miralax or Colace, if you have difficulty moving your bowels before surgery.  You have been prescribed Sennakot-S to take at bedtime every evening after surgery to keep bowel movements regular and to prevent constipation.     Wound Care: 1. Keep clean and dry.  Shower daily.   Reasons to call the Doctor: Fever - Oral temperature greater than 100.4 degrees Fahrenheit Foul-smelling vaginal discharge Difficulty urinating Nausea and vomiting Increased pain at the site of the incision that is unrelieved with pain medicine. Difficulty breathing with or without chest pain New calf pain especially if only on one side Sudden, continuing increased vaginal bleeding with or without clots.   Contacts: For questions or concerns you should contact:   Dr. Eugene Garnet at [phone]   Warner Mccreedy, NP at [phone]   After Hours: call [phone] and have the GYN Oncologist paged/contacted (after 5 pm or on the weekends). You will speak with an after hours RN and let he or she know you have had surgery.   Messages sent via mychart are for non-urgent matters and are not responded to after hours so for urgent needs, please call the after hours number.

## 2023-06-09 NOTE — Telephone Encounter (Signed)
Per Dr Pricilla Holm fax surgical optimization form to PCP (Dr Zola Button [phone])

## 2023-06-09 NOTE — Progress Notes (Signed)
Patient here for new patient consultation with Dr. Pricilla Holm and for a pre-operative appointment prior to her scheduled surgery on 07/05/2023. She is scheduled for robotic assisted total laparoscopic hysterectomy, bilateral salpingo-oophorectomy, sentinel lymph node biopsy, possible lymph node dissection, possible laparotomy. The surgery was discussed in detail.  See after visit summary for additional details.    Discussed post-op pain management in detail including the aspects of the enhanced recovery pathway.  Advised her that a new prescription would be sent in closer to the surgery date and it is only to be used for after her upcoming surgery.  We discussed the use of tylenol post-op and to monitor for a maximum of 4,000 mg in a 24 hour period.  Also will prescribe sennakot to be used after surgery and to hold if having loose stools.  Discussed bowel regimen in detail.     Discussed measures to take at home to prevent DVT including frequent mobility.  Reportable signs and symptoms of DVT discussed. Post-operative instructions discussed and expectations for after surgery. Incisional care discussed as well including reportable signs and symptoms including erythema, drainage, wound separation.     10 minutes spent preparing information and with the patient.  Verbalizing understanding of material discussed. No needs or concerns voiced at the end of the visit.   Advised patient to call for any needs.  Advised that her post-operative medications would be prescribed closer to the date.    This appointment is included in the global surgical bundle as pre-operative teaching and has no charge.

## 2023-06-09 NOTE — Patient Instructions (Addendum)
Please get in with your primary care provider for further workup of your cough before surgery to prevent delays/cancellation with anesthesia.   Preparing for your Surgery   Plan for surgery on July 05, 2023 with Dr. Eugene Garnet at [facility]. You will be scheduled for robotic assisted total laparoscopic hysterectomy (removal of the uterus and cervix), bilateral salpingo-oophorectomy (removal of both ovaries and fallopian tubes), sentinel lymph node biopsy, possible lymph node dissection, possible laparotomy (larger incision on your abdomen if needed).    Pre-operative Testing -You will receive a phone call from presurgical testing at [facility] to arrange for a pre-operative appointment and lab work.   -Bring your insurance card, copy of an advanced directive if applicable, medication list   -At that visit, you will be asked to sign a consent for a possible blood transfusion in case a transfusion becomes necessary during surgery.  The need for a blood transfusion is rare but having consent is a necessary part of your care.      -You should not be taking blood thinners or aspirin at least ten days prior to surgery unless instructed by your surgeon.   -Do not take supplements such as fish oil (omega 3), red yeast rice, turmeric before your surgery. STOP TAKING AT LEAST 10 DAYS BEFORE SURGERY. You want to avoid medications with aspirin in them including headache powders such as BC or Goody's), Excedrin migraine.   Day Before Surgery at Home -You will be asked to take in a light diet the day before surgery. You will be advised you can have clear liquids up until 3 hours before your surgery.     Eat a light diet the day before surgery.  Examples including soups, broths, toast, yogurt, mashed potatoes.  AVOID GAS PRODUCING FOODS AND BEVERAGES. Things to avoid include carbonated beverages (fizzy beverages, sodas), raw fruits and raw vegetables (uncooked), or beans.    If  your bowels are filled with gas, your surgeon will have difficulty visualizing your pelvic organs which increases your surgical risks.   Your role in recovery Your role is to become active as soon as directed by your doctor, while still giving yourself time to heal.  Rest when you feel tired. You will be asked to do the following in order to speed your recovery:   - Cough and breathe deeply. This helps to clear and expand your lungs and can prevent pneumonia after surgery.  - STAY ACTIVE WHEN YOU GET HOME. Do mild physical activity. Walking or moving your legs help your circulation and body functions return to normal. Do not try to get up or walk alone the first time after surgery.   -If you develop swelling on one leg or the other, pain in the back of your leg, redness/warmth in one of your legs, please call the office or go to the Emergency Room to have a doppler to rule out a blood clot. For shortness of breath, chest pain-seek care in the Emergency Room as soon as possible. - Actively manage your pain. Managing your pain lets you move in comfort. We will ask you to rate your pain on a scale of zero to 10. It is your responsibility to tell your doctor or nurse where and how much you hurt so your pain can be treated.   Special Considerations -If you are diabetic, you may be placed on insulin after surgery to have closer control over your blood sugars to promote healing and recovery.  This  does not mean that you will be discharged on insulin.  If applicable, your oral antidiabetics will be resumed when you are tolerating a solid diet.   -Your final pathology results from surgery should be available around one week after surgery and the results will be relayed to you when available.   -FMLA forms can be faxed to [phone] and please allow 5-7 business days for completion.   Pain Management After Surgery -You will be prescribed your pain medication and bowel regimen medications before surgery  closer to the date so that you can have these available when you are discharged from the hospital. The pain medication is for use ONLY AFTER surgery and a new prescription will not be given.    -Make sure that you have Tylenol and Ibuprofen IF YOU ARE ABLE TO TAKE THESE MEDICATIONS at home to use on a regular basis after surgery for pain control. We recommend alternating the medications every hour to six hours since they work differently and are processed in the body differently for pain relief.   -Review the attached handout on narcotic use and their risks and side effects.    Bowel Regimen -You will be prescribed Sennakot-S to take nightly to prevent constipation especially if you are taking the narcotic pain medication intermittently.  It is important to prevent constipation and drink adequate amounts of liquids. You can stop taking this medication when you are not taking pain medication and you are back on your normal bowel routine.   Risks of Surgery Risks of surgery are low but include bleeding, infection, damage to surrounding structures, re-operation, blood clots, and very rarely death.     Blood Transfusion Information (For the consent to be signed before surgery)   We will be checking your blood type before surgery so in case of emergencies, we will know what type of blood you would need.                                             WHAT IS A BLOOD TRANSFUSION?   A transfusion is the replacement of blood or some of its parts. Blood is made up of multiple cells which provide different functions. Red blood cells carry oxygen and are used for blood loss replacement. White blood cells fight against infection. Platelets control bleeding. Plasma helps clot blood. Other blood products are available for specialized needs, such as hemophilia or other clotting disorders. BEFORE THE TRANSFUSION  Who gives blood for transfusions?  You may be able to donate blood to be used at a later date on  yourself (autologous donation). Relatives can be asked to donate blood. This is generally not any safer than if you have received blood from a stranger. The same precautions are taken to ensure safety when a relative's blood is donated. Healthy volunteers who are fully evaluated to make sure their blood is safe. This is blood bank blood. Transfusion therapy is the safest it has ever been in the practice of medicine. Before blood is taken from a donor, a complete history is taken to make sure that person has no history of diseases nor engages in risky social behavior (examples are intravenous drug use or sexual activity with multiple partners). The donor's travel history is screened to minimize risk of transmitting infections, such as malaria. The donated blood is tested for signs of infectious diseases, such as HIV  and hepatitis. The blood is then tested to be sure it is compatible with you in order to minimize the chance of a transfusion reaction. If you or a relative donates blood, this is often done in anticipation of surgery and is not appropriate for emergency situations. It takes many days to process the donated blood. RISKS AND COMPLICATIONS Although transfusion therapy is very safe and saves many lives, the main dangers of transfusion include:  Getting an infectious disease. Developing a transfusion reaction. This is an allergic reaction to something in the blood you were given. Every precaution is taken to prevent this. The decision to have a blood transfusion has been considered carefully by your caregiver before blood is given. Blood is not given unless the benefits outweigh the risks.   AFTER SURGERY INSTRUCTIONS   Return to work: 4-6 weeks if applicable   Activity: 1. Be up and out of the bed during the day.  Take a nap if needed.  You may walk up steps but be careful and use the hand rail.  Stair climbing will tire you more than you think, you may need to stop part way and rest.    2.  No lifting or straining for 6 weeks over 10 pounds. No pushing, pulling, straining for 6 weeks.   3. No driving for 8-65 days when the following criteria have been met: Do not drive if you are taking narcotic pain medicine and make sure that your reaction time has returned.    4. You can shower as soon as the next day after surgery. Shower daily.  Use your regular soap and water (not directly on the incision) and pat your incision(s) dry afterwards; don't rub.  No tub baths or submerging your body in water until cleared by your surgeon. If you have the soap that was given to you by pre-surgical testing that was used before surgery, you do not need to use it afterwards because this can irritate your incisions.    5. No sexual activity and nothing in the vagina for 10-12 weeks.   6. You may experience a small amount of clear drainage from your incisions, which is normal.  If the drainage persists, increases, or changes color please call the office.   7. Do not use creams, lotions, or ointments such as neosporin on your incisions after surgery until advised by your surgeon because they can cause removal of the dermabond glue on your incisions.     8. You may experience vaginal spotting after surgery or when the stitches at the top of the vagina begin to dissolve.  The spotting is normal but if you experience heavy bleeding, call our office.   9. Take Tylenol or ibuprofen first for pain if you are able to take these medications and only use narcotic pain medication for severe pain not relieved by the Tylenol or Ibuprofen.  Monitor your Tylenol intake to a max of 4,000 mg in a 24 hour period. You can alternate these medications after surgery.   Diet: 1. Low sodium Heart Healthy Diet is recommended but you are cleared to resume your normal (before surgery) diet after your procedure.   2. It is safe to use a laxative, such as Miralax or Colace, if you have difficulty moving your bowels before surgery.  You have been prescribed Sennakot-S to take at bedtime every evening after surgery to keep bowel movements regular and to prevent constipation.     Wound Care: 1. Keep clean and dry.  Shower daily.   Reasons to call the Doctor: Fever - Oral temperature greater than 100.4 degrees Fahrenheit Foul-smelling vaginal discharge Difficulty urinating Nausea and vomiting Increased pain at the site of the incision that is unrelieved with pain medicine. Difficulty breathing with or without chest pain New calf pain especially if only on one side Sudden, continuing increased vaginal bleeding with or without clots.   Contacts: For questions or concerns you should contact:   Dr. Eugene Garnet at [phone]   Warner Mccreedy, NP at [phone]   After Hours: call [phone] and have the GYN Oncologist paged/contacted (after 5 pm or on the weekends). You will speak with an after hours RN and let he or she know you have had surgery.   Messages sent via mychart are for non-urgent matters and are not responded to after hours so for urgent needs, please call the after hours number.

## 2023-06-21 ENCOUNTER — Other Ambulatory Visit: Payer: Self-pay | Admitting: Oncology

## 2023-06-21 DIAGNOSIS — C541 Malignant neoplasm of endometrium: Secondary | ICD-10-CM

## 2023-06-21 NOTE — Progress Notes (Signed)
 Misc lab order placed for lavender tube of blood for MSI testing on accession [id number].

## 2023-06-27 ENCOUNTER — Encounter (HOSPITAL_COMMUNITY): Payer: Self-pay

## 2023-06-27 ENCOUNTER — Ambulatory Visit: Payer: Medicare Other | Admitting: Family Medicine

## 2023-06-27 ENCOUNTER — Encounter: Payer: Self-pay | Admitting: Family Medicine

## 2023-06-27 VITALS — BP 112/78 | HR 76 | Temp 97.7°F | Resp 18 | Ht 60.0 in | Wt 174.2 lb

## 2023-06-27 DIAGNOSIS — J069 Acute upper respiratory infection, unspecified: Secondary | ICD-10-CM

## 2023-06-27 DIAGNOSIS — R051 Acute cough: Secondary | ICD-10-CM | POA: Diagnosis not present

## 2023-06-27 DIAGNOSIS — M25562 Pain in left knee: Secondary | ICD-10-CM

## 2023-06-27 LAB — POC COVID19 BINAXNOW: SARS Coronavirus 2 Ag: NEGATIVE

## 2023-06-27 MED ORDER — PROMETHAZINE-DM 6.25-15 MG/5ML PO SYRP: 5.0000 mL | ORAL_SOLUTION | Freq: Four times a day (QID) | ORAL | 0 refills | Status: DC | PRN

## 2023-06-27 MED ORDER — FLUTICASONE PROPIONATE 50 MCG/ACT NA SUSP: 2.0000 | Freq: Every day | NASAL | 6 refills | Status: DC

## 2023-06-27 MED ORDER — AZITHROMYCIN 250 MG PO TABS: ORAL_TABLET | ORAL | 0 refills | Status: DC

## 2023-06-27 NOTE — Progress Notes (Signed)
 Established Patient Office Visit  Subjective   Patient ID: Elizabeth [name], female    DOB: [birth year]  Age: 68 y.o. MRN: [id number]  Chief Complaint  Patient presents with   Cough    X2 weeks, pt states productive cough and nasal congestion. No covid test.    Knee Pain    Left knee, no falls or injuries.    HPI Discussed the use of AI scribe software for clinical note transcription with the patient, who gave verbal consent to proceed.  History of Present Illness   The patient, with a history of gynecological issues, is scheduled for a complete hysterectomy due to a cancerous polyp discovered during a previous D&C procedure in December. The surgery is to be performed by a gynecological oncologist. The patient has been experiencing a persistent cough for a couple of weeks, which she attributes to nasal congestion upon waking. She has been self-medicating with over-the-counter Robitussin and a prescribed cough syrup. The cough is more pronounced at night and is accompanied by the production of mucus. The patient denies any fevers or feelings of malaise.  In addition to the cough, the patient reports intermittent soreness in her left knee, particularly upon waking and when weight is applied. The pain is localized to the outer side of the knee. She denies any history of trauma to the knee and has not sought medical attention for this issue. The patient manages the discomfort by rubbing the area and has not taken any analgesics for it.  The patient also has a history of allergies to certain nail products, specifically SNS and gel, which have previously caused significant skin reactions. She has since avoided these products.      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   Anemia 11/29/2010   Dizzy 11/29/2010   Past Medical History:  Diagnosis Date   Acne    Eczema    Past Surgical History:  Procedure Laterality Date   DILATATION & CURETTAGE/HYSTEROSCOPY WITH MYOSURE N/A 05/26/2023   Procedure: DILATATION & CURETTAGE/HYSTEROSCOPY WITH MYOSURE;  Surgeon: Gorge Ade, MD;  Location: [facility];  Service: Gynecology;  Laterality: N/A;   TUBAL LIGATION     Social History   Tobacco Use   Smoking status: Never   Smokeless tobacco: Never  Vaping Use   Vaping status: Never Used  Substance Use Topics   Alcohol use: No   Drug use: No   Social History   Socioeconomic History   Marital status: Single    Spouse name: Not on file   Number of children: 1   Years of education: 14   Highest education level: Not on file  Occupational History   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[employer]    Employer: [employer]  Tobacco Use   Smoking status: Never   Smokeless tobacco: Never  Vaping Use   Vaping status: Never Used  Substance and Sexual Activity   Alcohol use: No   Drug use: No   Sexual activity: Never    Partners: Male  Other Topics Concern   Not on file  Social History Narrative   Exercise--- walk   Social Drivers of Health  Financial Resource Strain: Not on file  Food Insecurity: No Food Insecurity (06/08/2023)   Hunger Vital Sign    Worried About Running Out of Food in the Last Year: Never true    Ran Out of Food in the Last Year: Never true  Transportation Needs: No Transportation Needs (06/08/2023)   PRAPARE - Administrator, Civil Service (Medical): No    Lack of Transportation (Non-Medical): No  Physical Activity: Not on file  Stress: Not on file  Social Connections: Not on file  Intimate Partner Violence: Not At Risk (06/08/2023)   Humiliation, Afraid, Rape, and Kick questionnaire    Fear of Current or Ex-Partner: No     Emotionally Abused: No    Physically Abused: No    Sexually Abused: No   Family Status  Relation Name Status   Mother  Deceased at age 68       hep c   Father  Deceased at age 26   Sister  Alive   Sister  Alive   Sister  Alive   Brother  Alive   MGM  Deceased       child birth   MGF  Deceased at age 23       aneurysm   PGM  Deceased   PGF  Deceased   Mat Aunt  (Not Specified)   Youth Worker  (Not Specified)   Neg Hx  (Not Specified)  No partnership data on file   Family History  Problem Relation Age of Onset   Hepatitis Mother 59       hep C   Heart disease Father 25       MI   Breast cancer Sister    Cancer Sister 53       breast   Dementia Sister 78   Diabetes Brother    Hyperlipidemia Brother    Hypertension Brother    Aneurysm Maternal Grandfather    Breast cancer Maternal Aunt    Breast cancer Maternal Aunt    Colon cancer Neg Hx    Ovarian cancer Neg Hx    Endometrial cancer Neg Hx    Pancreatic cancer Neg Hx    Prostate cancer Neg Hx    Allergies  Allergen Reactions   2-(Ethylmercuriothio)Benzoic Acid     Other reaction(s): Other (See Comments) Positive patch test   Hydroxyethyl Methacrylate Rash    Positive patch test   Other Rash    Ethyl cyanoacrylate- Positive patch test Methyl methacrylate- Positive patch test      Review of Systems  Constitutional:  Negative for chills, fever and malaise/fatigue.  HENT:  Positive for congestion and sore throat. Negative for hearing loss.   Eyes:  Negative for blurred vision and discharge.  Respiratory:  Positive for cough and sputum production. Negative for shortness of breath.   Cardiovascular:  Negative for chest pain, palpitations and leg swelling.  Gastrointestinal:  Negative for abdominal pain, blood in stool, constipation, diarrhea, heartburn, nausea and vomiting.  Genitourinary:  Negative for dysuria, frequency, hematuria and urgency.  Musculoskeletal:  Positive for joint pain. Negative for back  pain, falls and myalgias.  Skin:  Negative for rash.  Neurological:  Negative for dizziness, sensory change, loss of consciousness, weakness and headaches.  Endo/Heme/Allergies:  Negative for environmental allergies. Does not bruise/bleed easily.  Psychiatric/Behavioral:  Negative for depression and suicidal ideas. The patient is not nervous/anxious and does not have insomnia.       Objective:     BP  112/78 (BP Location: Left Arm, Patient Position: Sitting, Cuff Size: Normal)   Pulse 76   Temp 97.7 F (36.5 C) (Oral)   Resp 18   Ht 5' (1.524 m)   Wt 174 lb 3.2 oz (79 kg)   SpO2 95%   BMI 34.02 kg/m  BP Readings from Last 3 Encounters:  06/27/23 112/78  06/09/23 118/69  05/26/23 117/62   Wt Readings from Last 3 Encounters:  06/27/23 174 lb 3.2 oz (79 kg)  06/09/23 175 lb 12.8 oz (79.7 kg)  05/26/23 177 lb 1.6 oz (80.3 kg)   SpO2 Readings from Last 3 Encounters:  06/27/23 95%  06/09/23 98%  05/26/23 100%      Physical Exam Vitals and nursing note reviewed.  Constitutional:      General: She is not in acute distress.    Appearance: Normal appearance. She is well-developed.  HENT:     Head: Normocephalic and atraumatic.     Nose: Congestion present. No rhinorrhea.  Eyes:     General: No scleral icterus.       Right eye: No discharge.        Left eye: No discharge.  Cardiovascular:     Rate and Rhythm: Normal rate and regular rhythm.     Heart sounds: No murmur heard. Pulmonary:     Effort: Pulmonary effort is normal. No respiratory distress.     Breath sounds: Normal breath sounds.  Musculoskeletal:        General: No tenderness or signs of injury. Normal range of motion.     Cervical back: Normal range of motion and neck supple.     Right lower leg: No edema.     Left lower leg: No edema.  Skin:    General: Skin is warm and dry.  Neurological:     General: No focal deficit present.     Mental Status: She is alert and oriented to person, place, and time.   Psychiatric:        Mood and Affect: Mood normal.        Behavior: Behavior normal.        Thought Content: Thought content normal.        Judgment: Judgment normal.      Results for orders placed or performed in visit on 06/27/23  POC COVID-19  Result Value Ref Range   SARS Coronavirus 2 Ag Negative Negative    Last CBC Lab Results  Component Value Date   WBC 7.6 05/26/2023   HGB 13.7 05/26/2023   HCT 40.1 05/26/2023   MCV 93.3 05/26/2023   MCH 31.9 05/26/2023   RDW 12.6 05/26/2023   PLT 245 05/26/2023   Last metabolic panel Lab Results  Component Value Date   GLUCOSE 95 10/14/2022   NA 139 10/14/2022   K 4.6 10/14/2022   CL 103 10/14/2022   CO2 28 10/14/2022   BUN 14 10/14/2022   CREATININE 0.94 10/14/2022   GFR 70.70 08/09/2021   CALCIUM 10.4 10/14/2022   PROT 7.5 10/14/2022   ALBUMIN  4.4 08/09/2021   BILITOT 0.8 10/14/2022   ALKPHOS 77 08/09/2021   AST 23 10/14/2022   ALT 21 10/14/2022   Last lipids Lab Results  Component Value Date   CHOL 206 (H) 10/14/2022   HDL 62 10/14/2022   LDLCALC 124 (H) 10/14/2022   LDLDIRECT 140.8 04/10/2012   TRIG 102 10/14/2022   CHOLHDL 3.3 10/14/2022   Last hemoglobin A1c No results found for: HGBA1C Last thyroid  functions  Lab Results  Component Value Date   TSH 0.81 08/09/2021   Last vitamin D  Lab Results  Component Value Date   VD25OH 30.57 08/23/2019   Last vitamin B12 and Folate Lab Results  Component Value Date   VITAMINB12 271 11/29/2010   FOLATE 16.1 11/23/2009    The 10-year ASCVD risk score (Arnett DK, et al., 2019) is: 7.1%    Assessment & Plan:   Problem List Items Addressed This Visit   None Visit Diagnoses       Acute pain of left knee    -  Primary     Acute cough       Relevant Medications   azithromycin  (ZITHROMAX  Z-PAK) 250 MG tablet   promethazine -dextromethorphan (PROMETHAZINE -DM) 6.25-15 MG/5ML syrup   Other Relevant Orders   POC COVID-19 (Completed)     Upper  respiratory tract infection, unspecified type       Relevant Medications   fluticasone  (FLONASE ) 50 MCG/ACT nasal spray   azithromycin  (ZITHROMAX  Z-PAK) 250 MG tablet     Covid negative   Assessment and Plan    Postoperative Care for Endometrial Cancer Scheduled for surgery on July 05, 2023, due to endometrial cancer, following a December D&C that revealed a cancerous polyp. There is concern about a persistent cough potentially affecting the surgery. It is important to clear any potential infection before surgery to avoid complications. Prescribe azithromycin  (Z-Pak), Fluticasone  (Flonase ), and cough syrup.  Chronic Cough The cough has persisted for a couple of weeks, worsening in the mornings with nasal congestion and mucus production. There is no fever or significant sinus pressure. The cough is less frequent now compared to the holiday season. It is crucial to clear the cough before surgery to avoid postponement. Prescribe azithromycin  (Z-Pak), Fluticasone  (Flonase ), and cough syrup. Advise calling if symptoms do not improve.  Left Knee Pain Experiencing intermittent soreness in the left knee, especially when first getting up or putting weight on it. There is no history of trauma or injury, and pain is localized to the side of the knee without significant swelling or tenderness on examination. Discussed the potential benefit of weight loss for knee pain management. Recommend Tylenol  Arthritis and advise monitoring symptoms, reporting if pain worsens or if Tylenol  is ineffective.  General Health Maintenance Discussed the importance of COVID-19 testing for any respiratory or GI symptoms. There is an awareness of an allergy to SNS and gel nail products. Advise a COVID-19 test if experiencing respiratory or GI symptoms and to avoid SNS and gel nail products due to allergy.  Follow-up Follow up post-surgery to monitor recovery and address any concerns.        Return if symptoms worsen or  fail to improve.    Oliviana Mcgahee R Lowne Chase, DO

## 2023-06-27 NOTE — Patient Instructions (Addendum)
SURGICAL WAITING ROOM VISITATION Patients having surgery or a procedure may have no more than 2 support people in the waiting area - these visitors may rotate.    Children under the age of 75 must have an adult with them who is not the patient.  Due to an increase in RSV and influenza rates and associated hospitalizations, children ages 25 and under may not visit patients in [facility] hospitals.   If the patient needs to stay at the hospital during part of their recovery, the visitor guidelines for inpatient rooms apply. Pre-op nurse will coordinate an appropriate time for 1 support person to accompany patient in pre-op.  This support person may not rotate.    Please refer to the [facility] website for the visitor guidelines for Inpatients (after your surgery is over and you are in a regular room).       Your procedure is scheduled on: 07-05-23   Report to [facility] Main Entrance    Report to admitting at 5:45 AM   Call this number if you have problems the morning of surgery [phone]   Follow a light diet the day before surgery (avoid gas producing foods)   Do not eat food :After Midnight.   After Midnight you may have the following liquids until 5:00 AM DAY OF SURGERY  Water Non-Citrus Juices (without pulp, NO RED-Apple, White grape, White cranberry) Black Coffee (NO MILK/CREAM OR CREAMERS, sugar ok)  Clear Tea (NO MILK/CREAM OR CREAMERS, sugar ok) regular and decaf                             Plain Jell-O (NO RED)                                           Fruit ices (not with fruit pulp, NO RED)                                     Popsicles (NO RED)                                                               Sports drinks like Gatorade (NO RED)                     If you have questions, please contact your surgeon's office.   FOLLOW BOWEL PREP AND ANY ADDITIONAL PRE OP INSTRUCTIONS YOU RECEIVED FROM YOUR SURGEON'S OFFICE!!!     Oral Hygiene is also  important to reduce your risk of infection.                                    Remember - BRUSH YOUR TEETH THE MORNING OF SURGERY WITH YOUR REGULAR TOOTHPASTE   Do NOT smoke after Midnight   Take these medicines the morning of surgery with A SIP OF WATER:   Azithromycin  Promethazine if needed  Stop all vitamins and herbal supplements 7 days before surgery  You may not have any metal on your body including hair pins, jewelry, and body piercing             Do not wear make-up, lotions, powders, perfumes or deodorant  Do not wear nail polish including gel and S&S, artificial/acrylic nails, or any other type of covering on natural nails including finger and toenails. If you have artificial nails, gel coating, etc. that needs to be removed by a nail salon please have this removed prior to surgery or surgery may need to be canceled/ delayed if the surgeon/ anesthesia feels like they are unable to be safely monitored.   Do not shave  48 hours prior to surgery.    Do not bring valuables to the hospital. [facility] IS NOT RESPONSIBLE   FOR VALUABLES.   Contacts, dentures or bridgework may not be worn into surgery.  DO NOT BRING YOUR HOME MEDICATIONS TO THE HOSPITAL. PHARMACY WILL DISPENSE MEDICATIONS LISTED ON YOUR MEDICATION LIST TO YOU DURING YOUR ADMISSION IN THE HOSPITAL!    Patients discharged on the day of surgery will not be allowed to drive home.  Someone NEEDS to stay with you for the first 24 hours after anesthesia.              Please read over the following fact sheets you were given: IF YOU HAVE QUESTIONS ABOUT YOUR PRE-OP INSTRUCTIONS PLEASE CALL [phone] [facility]  If you received a COVID test during your pre-op visit  it is requested that you wear a mask when out in public, stay away from anyone that may not be feeling well and notify your surgeon if you develop symptoms. If you test positive for Covid or have been in contact with anyone that has tested positive in  the last 10 days please notify you surgeon.  [facility] - Preparing for Surgery Before surgery, you can play an important role.  Because skin is not sterile, your skin needs to be as free of germs as possible.  You can reduce the number of germs on your skin by washing with CHG (chlorahexidine gluconate) soap before surgery.  CHG is an antiseptic cleaner which kills germs and bonds with the skin to continue killing germs even after washing. Please DO NOT use if you have an allergy to CHG or antibacterial soaps.  If your skin becomes reddened/irritated stop using the CHG and inform your nurse when you arrive at Short Stay. Do not shave (including legs and underarms) for at least 48 hours prior to the first CHG shower.  You may shave your face/neck.  Please follow these instructions carefully:  1.  Shower with CHG Soap the night before surgery and the  morning of surgery.  2.  If you choose to wash your hair, wash your hair first as usual with your normal  shampoo.  3.  After you shampoo, rinse your hair and body thoroughly to remove the shampoo.                             4.  Use CHG as you would any other liquid soap.  You can apply chg directly to the skin and wash.  Gently with a scrungie or clean washcloth.  5.  Apply the CHG Soap to your body ONLY FROM THE NECK DOWN.   Do   not use on face/ open  Wound or open sores. Avoid contact with eyes, ears mouth and   genitals (private parts).                       Wash face,  Genitals (private parts) with your normal soap.             6.  Wash thoroughly, paying special attention to the area where your    surgery  will be performed.  7.  Thoroughly rinse your body with warm water from the neck down.  8.  DO NOT shower/wash with your normal soap after using and rinsing off the CHG Soap.                9.  Pat yourself dry with a clean towel.            10.  Wear clean pajamas.            11.  Place clean sheets on your bed  the night of your first shower and do not  sleep with pets. Day of Surgery : Do not apply any lotions/deodorants the morning of surgery.  Please wear clean clothes to the hospital/surgery center.  FAILURE TO FOLLOW THESE INSTRUCTIONS MAY RESULT IN THE CANCELLATION OF YOUR SURGERY  PATIENT SIGNATURE_________________________________  NURSE SIGNATURE__________________________________  ________________________________________________________________________   WHAT IS A BLOOD TRANSFUSION? Blood Transfusion Information  A transfusion is the replacement of blood or some of its parts. Blood is made up of multiple cells which provide different functions. Red blood cells carry oxygen and are used for blood loss replacement. White blood cells fight against infection. Platelets control bleeding. Plasma helps clot blood. Other blood products are available for specialized needs, such as hemophilia or other clotting disorders. BEFORE THE TRANSFUSION  Who gives blood for transfusions?  Healthy volunteers who are fully evaluated to make sure their blood is safe. This is blood bank blood. Transfusion therapy is the safest it has ever been in the practice of medicine. Before blood is taken from a donor, a complete history is taken to make sure that person has no history of diseases nor engages in risky social behavior (examples are intravenous drug use or sexual activity with multiple partners). The donor's travel history is screened to minimize risk of transmitting infections, such as malaria. The donated blood is tested for signs of infectious diseases, such as HIV and hepatitis. The blood is then tested to be sure it is compatible with you in order to minimize the chance of a transfusion reaction. If you or a relative donates blood, this is often done in anticipation of surgery and is not appropriate for emergency situations. It takes many days to process the donated blood. RISKS AND COMPLICATIONS Although  transfusion therapy is very safe and saves many lives, the main dangers of transfusion include:  Getting an infectious disease. Developing a transfusion reaction. This is an allergic reaction to something in the blood you were given. Every precaution is taken to prevent this. The decision to have a blood transfusion has been considered carefully by your caregiver before blood is given. Blood is not given unless the benefits outweigh the risks. AFTER THE TRANSFUSION Right after receiving a blood transfusion, you will usually feel much better and more energetic. This is especially true if your red blood cells have gotten low (anemic). The transfusion raises the level of the red blood cells which carry oxygen, and this usually causes an energy increase. The nurse administering the  transfusion will monitor you carefully for complications. HOME CARE INSTRUCTIONS  No special instructions are needed after a transfusion. You may find your energy is better. Speak with your caregiver about any limitations on activity for underlying diseases you may have. SEEK MEDICAL CARE IF:  Your condition is not improving after your transfusion. You develop redness or irritation at the intravenous (IV) site. SEEK IMMEDIATE MEDICAL CARE IF:  Any of the following symptoms occur over the next 12 hours: Shaking chills. You have a temperature by mouth above 102 F (38.9 C), not controlled by medicine. Chest, back, or muscle pain. People around you feel you are not acting correctly or are confused. Shortness of breath or difficulty breathing. Dizziness and fainting. You get a rash or develop hives. You have a decrease in urine output. Your urine turns a dark color or changes to pink, red, or brown. Any of the following symptoms occur over the next 10 days: You have a temperature by mouth above 102 F (38.9 C), not controlled by medicine. Shortness of breath. Weakness after normal activity. The white part of the eye  turns yellow (jaundice). You have a decrease in the amount of urine or are urinating less often. Your urine turns a dark color or changes to pink, red, or brown. Document Released: 05/27/2000 Document Revised: 08/22/2011 Document Reviewed: 01/14/2008 [facility] Patient Information 2014 [place], [place].  _______________________________________________________________________

## 2023-06-27 NOTE — Patient Instructions (Addendum)

## 2023-06-28 NOTE — Progress Notes (Addendum)
COVID Vaccine Completed:  Yes  Date of COVID positive in last 90 days:  No  PCP - Seabron Spates, DO Cardiologist - N/A  Chest x-ray - N/A EKG - 10-14-22 Epic Stress Test - N/A ECHO - N/A Cardiac Cath - N/A Pacemaker/ICD device last checked: Spinal Cord Stimulator:N/A  Bowel Prep - N/A  Sleep Study - N/A CPAP -   Fasting Blood Sugar - N/A Checks Blood Sugar _____ times a day  Last dose of GLP1 agonist-  N/A GLP1 instructions:  Hold 7 days before surgery    Last dose of SGLT-2 inhibitors-  N/A SGLT-2 instructions:  Hold 3 days before surgery   Blood Thinner Instructions:  N/A Aspirin Instructions: Last Dose:  Activity level:  Can go up a flight of stairs and perform activities of daily living without stopping and without symptoms of chest pain or shortness of breath.  Able to exercise without symptoms  Anesthesia review:  Dr. Pricilla Holm treated patient for a cough, patient states that cough has resolved and she is having no other symptoms.  Patient denies shortness of breath, fever, cough and chest pain at PAT appointment  Patient verbalized understanding of instructions that were given to them at the PAT appointment. Patient was also instructed that they will need to review over the PAT instructions again at home before surgery.

## 2023-06-29 ENCOUNTER — Telehealth: Payer: Self-pay | Admitting: Family Medicine

## 2023-06-29 ENCOUNTER — Other Ambulatory Visit: Payer: Self-pay | Admitting: Oncology

## 2023-06-29 ENCOUNTER — Encounter (HOSPITAL_COMMUNITY): Payer: Self-pay

## 2023-06-29 ENCOUNTER — Encounter (HOSPITAL_COMMUNITY)
Admission: RE | Admit: 2023-06-29 | Discharge: 2023-06-29 | Disposition: A | Discharge status: Home / Self Care | Payer: Medicare Other | Source: Ambulatory Visit | Attending: Gynecologic Oncology | Admitting: Gynecologic Oncology

## 2023-06-29 ENCOUNTER — Encounter: Payer: Self-pay | Admitting: Obstetrics and Gynecology

## 2023-06-29 ENCOUNTER — Other Ambulatory Visit: Payer: Self-pay

## 2023-06-29 DIAGNOSIS — C541 Malignant neoplasm of endometrium: Secondary | ICD-10-CM | POA: Insufficient documentation

## 2023-06-29 DIAGNOSIS — Z01812 Encounter for preprocedural laboratory examination: Secondary | ICD-10-CM | POA: Insufficient documentation

## 2023-06-29 DIAGNOSIS — Z01818 Encounter for other preprocedural examination: Secondary | ICD-10-CM | POA: Diagnosis present

## 2023-06-29 HISTORY — DX: Malignant neoplasm of endometrium: C54.1

## 2023-06-29 LAB — COMPREHENSIVE METABOLIC PANEL
ALT: 18 U/L (ref 0–44)
AST: 19 U/L (ref 15–41)
Albumin: 4 g/dL (ref 3.5–5.0)
Alkaline Phosphatase: 43 U/L (ref 38–126)
Anion gap: 8 (ref 5–15)
BUN: 20 mg/dL (ref 8–23)
CO2: 22 mmol/L (ref 22–32)
Calcium: 9.7 mg/dL (ref 8.9–10.3)
Chloride: 108 mmol/L (ref 98–111)
Creatinine, Ser: 0.82 mg/dL (ref 0.44–1.00)
GFR, Estimated: >60 mL/min (ref >60)
Glucose, Bld: 110 mg/dL — ABNORMAL HIGH (ref 70–99)
Potassium: 4.1 mmol/L (ref 3.5–5.1)
Sodium: 138 mmol/L (ref 135–145)
Total Bilirubin: 0.6 mg/dL (ref 0.0–1.2)
Total Protein: 7.6 g/dL (ref 6.5–8.1)

## 2023-06-29 LAB — CBC
HCT: 41.9 % (ref 36.0–46.0)
Hemoglobin: 13.4 g/dL (ref 12.0–15.0)
MCH: 30.2 pg (ref 26.0–34.0)
MCHC: 32 g/dL (ref 30.0–36.0)
MCV: 94.4 fL (ref 80.0–100.0)
Platelets: 266 10*3/uL (ref 150–400)
RBC: 4.44 MIL/uL (ref 3.87–5.11)
RDW: 11.8 % (ref 11.5–15.5)
WBC: 6.8 10*3/uL (ref 4.0–10.5)
nRBC: 0 % (ref 0.0–0.2)

## 2023-06-29 NOTE — Telephone Encounter (Signed)
Received and placed in folder for sig

## 2023-06-29 NOTE — Telephone Encounter (Signed)
Copied from CRM [id number]. Topic: General - Other >> Jun 29, 2023  9:33 AM [place] A wrote: Reason for CRM: Marcelino Duster with [facility] called in reference to a fax that was sent on 06/09/2023 and 06/28/2023. Call back number is [phone]

## 2023-06-29 NOTE — Progress Notes (Signed)
Order entered for extra lavender tube for MSI testing on accession [id number].

## 2023-07-03 ENCOUNTER — Telehealth: Payer: Self-pay | Admitting: Family Medicine

## 2023-07-03 NOTE — Telephone Encounter (Signed)
Copied from CRM [id number]. Topic: General - Call Back - No Documentation >> Jul 03, 2023 10:28 AM Steele Sizer wrote: Reason for CRM: Marcelino Duster called from [facility] and would like to confirm that the pt paperwork "Surgical Optimization Form" has been receive and signed. The pt has a surgery this week and need this form back as soon as possible. Callback number is [phone]

## 2023-07-04 ENCOUNTER — Other Ambulatory Visit: Payer: Self-pay | Admitting: Gynecologic Oncology

## 2023-07-04 ENCOUNTER — Encounter: Payer: Self-pay | Admitting: Family Medicine

## 2023-07-04 ENCOUNTER — Ambulatory Visit: Payer: Medicare Other | Admitting: Family Medicine

## 2023-07-04 ENCOUNTER — Encounter (HOSPITAL_COMMUNITY): Payer: Self-pay | Admitting: Gynecologic Oncology

## 2023-07-04 ENCOUNTER — Telehealth: Payer: Self-pay | Admitting: *Deleted

## 2023-07-04 VITALS — BP 110/68 | HR 70 | Temp 97.8°F | Resp 18 | Ht 60.0 in | Wt 176.0 lb

## 2023-07-04 DIAGNOSIS — C541 Malignant neoplasm of endometrium: Secondary | ICD-10-CM

## 2023-07-04 DIAGNOSIS — Z01818 Encounter for other preprocedural examination: Secondary | ICD-10-CM

## 2023-07-04 MED ORDER — SENNOSIDES-DOCUSATE SODIUM 8.6-50 MG PO TABS: 2.0000 | ORAL_TABLET | Freq: Every day | ORAL | 0 refills | Status: DC

## 2023-07-04 NOTE — Telephone Encounter (Signed)
Telephone call to check on pre-operative status.  Patient compliant with pre-operative instructions.  Reinforced nothing to eat after midnight. Clear liquids until 0445. Patient to arrive at 65.  No questions or concerns voiced.  Instructed to call for any needs.

## 2023-07-04 NOTE — Telephone Encounter (Signed)
We have received the forms. Pt was called and stated she would call back to schedule.

## 2023-07-04 NOTE — Anesthesia Preprocedure Evaluation (Signed)
Anesthesia Evaluation  Patient identified by MRN, date of birth, ID band Patient awake    Reviewed: Allergy & Precautions, NPO status , Patient's Chart, lab work & pertinent test results  Airway Mallampati: II       Dental no notable dental hx. (+) Teeth Intact, Dental Advisory Given   Pulmonary neg pulmonary ROS   Pulmonary exam normal breath sounds clear to auscultation       Cardiovascular negative cardio ROS Normal cardiovascular exam Rhythm:Regular Rate:Normal     Neuro/Psych  Neuromuscular disease  negative psych ROS   GI/Hepatic negative GI ROS, Neg liver ROS,,,  Endo/Other  Obesity  Renal/GU negative Renal ROS  negative genitourinary   Musculoskeletal Eczema   Abdominal  (+) + obese  Peds  Hematology  (+) Blood dyscrasia, anemia   Anesthesia Other Findings   Reproductive/Obstetrics Endometrial Ca                              Anesthesia Physical Anesthesia Plan  ASA: 2  Anesthesia Plan: General   Post-op Pain Management: Dilaudid IV, Precedex and Tylenol PO (pre-op)*   Induction: Intravenous  PONV Risk Score and Plan: 4 or greater and Treatment may vary due to age or medical condition, Ondansetron and Dexamethasone  Airway Management Planned: Oral ETT  Additional Equipment: None  Intra-op Plan:   Post-operative Plan: Extubation in OR  Informed Consent: I have reviewed the patients History and Physical, chart, labs and discussed the procedure including the risks, benefits and alternatives for the proposed anesthesia with the patient or authorized representative who has indicated his/her understanding and acceptance.     Dental advisory given  Plan Discussed with: Anesthesiologist and CRNA  Anesthesia Plan Comments:         Anesthesia Quick Evaluation

## 2023-07-04 NOTE — Telephone Encounter (Signed)
Pt had appointment today. Clearance form and EKG faxed.

## 2023-07-04 NOTE — Progress Notes (Signed)
Subjective:    Elizabeth [name] is a 68 y.o. female who presents to the office today for a preoperative consultation at the request of surgeon Dr Pricilla Holm  who plans on performing TAH SBO on January 22. This consultation is requested for the specific conditions prompting preoperative evaluation (i.e. because of potential affect on operative risk): advanced age, endometrial cancer. Planned anesthesia: general. The patient has the following known anesthesia issues:  no problems . Patients bleeding risk: uterine bleeding due to cancer . Patient does not have objections to receiving blood products if needed.  The following portions of the patient's history were reviewed and updated as appropriate: She  has a past medical history of Acne, Eczema, and Endometrial cancer (HCC). She does not have any pertinent problems on file. She  has a past surgical history that includes Tubal ligation and Dilatation & curettage/hysteroscopy with myosure (N/A, 05/26/2023). Her family history includes Aneurysm in her maternal grandfather; Breast cancer in her maternal aunt, maternal aunt, and sister; Cancer (age of onset: 78) in her sister; Dementia (age of onset: 71) in her sister; Diabetes in her brother; Heart disease (age of onset: 65) in her father; Hepatitis (age of onset: 48) in her mother; Hyperlipidemia in her brother; Hypertension in her brother. She  reports that she has never smoked. She has never used smokeless tobacco. She reports that she does not drink alcohol and does not use drugs. She has a current medication list which includes the following prescription(s): vitamin c, azithromycin, b complex vitamins, calcium carbonate, vitamin d, fluticasone, multivitamin with minerals, norethindrone, oxycodone, oxycodone, promethazine-dextromethorphan, and senna-docusate. Current Outpatient Medications on File Prior to Visit  Medication Sig Dispense Refill   Ascorbic Acid (VITAMIN C) 1000 MG tablet Take 1,000 mg by mouth  daily.     azithromycin (ZITHROMAX Z-PAK) 250 MG tablet As directed 6 each 0   B Complex Vitamins (B COMPLEX 100 PO) Take 1 tablet by mouth daily.     calcium carbonate (OS-CAL) 600 MG tablet Take 1,200 mg by mouth daily.     Cholecalciferol (VITAMIN D) 50 MCG (2000 UT) CAPS Take 2,000 Units by mouth daily.     fluticasone (FLONASE) 50 MCG/ACT nasal spray Place 2 sprays into both nostrils daily. 16 g 6   Multiple Vitamin (MULTIVITAMIN WITH MINERALS) TABS tablet Take 1 tablet by mouth daily.     norethindrone (GALLIFREY) 5 MG tablet Take 5 mg by mouth daily.     oxyCODONE (OXY IR/ROXICODONE) 5 MG immediate release tablet Take 1 tablet (5 mg total) by mouth every 6 (six) hours as needed for severe pain (pain score 7-10). 15 tablet 0   oxyCODONE (OXY IR/ROXICODONE) 5 MG immediate release tablet Take 1 tablet (5 mg total) by mouth every 6 (six) hours as needed for severe pain. (pain score 7-10) 15 tablet 0   promethazine-dextromethorphan (PROMETHAZINE-DM) 6.25-15 MG/5ML syrup Take 5 mLs by mouth 4 (four) times daily as needed. 118 mL 0   No current facility-administered medications on file prior to visit.   She is allergic to 2-(ethylmercuriothio)benzoic acid, hydroxyethyl methacrylate, and other..  Review of Systems Review of Systems  Constitutional: Negative for activity change, appetite change and fatigue.  HENT: Negative for hearing loss, congestion, tinnitus and ear discharge.  dentist q55m Eyes: Negative for visual disturbance Respiratory: Negative for cough, chest tightness and shortness of breath.   Cardiovascular: Negative for chest pain, palpitations and leg swelling.  Gastrointestinal: Negative for abdominal pain, diarrhea, constipation and abdominal distention.  Genitourinary: Negative  for urgency, frequency, decreased urine volume and difficulty urinating.  Musculoskeletal: Negative for back pain, arthralgias and gait problem.  Skin: Negative for color change, pallor and rash.   Neurological: Negative for dizziness, light-headedness, numbness and headaches.  Hematological: Negative for adenopathy. Does not bruise/bleed easily.  Psychiatric/Behavioral: Negative for suicidal ideas, confusion, sleep disturbance, self-injury, dysphoric mood, decreased concentration and agitation.       Objective:    BP 110/68 (BP Location: Left Arm, Patient Position: Sitting, Cuff Size: Normal)   Pulse 70   Temp 97.8 F (36.6 C) (Oral)   Resp 18   Ht 5' (1.524 m)   Wt 176 lb (79.8 kg)   SpO2 98%   BMI 34.37 kg/m  General appearance: alert, cooperative, appears stated age, and no distress Head: Normocephalic, without obvious abnormality, atraumatic Ears: normal TM's and external ear canals both ears Nose: Nares normal. Septum midline. Mucosa normal. No drainage or sinus tenderness. Throat: lips, mucosa, and tongue normal; teeth and gums normal Neck: no adenopathy, no carotid bruit, no JVD, supple, symmetrical, trachea midline, and thyroid not enlarged, symmetric, no tenderness/mass/nodules Back: symmetric, no curvature. ROM normal. No CVA tenderness. Lungs: clear to auscultation bilaterally Heart: regular rate and rhythm, S1, S2 normal, no murmur, click, rub or gallop Abdomen: soft, non-tender; bowel sounds normal; no masses,  no organomegaly Extremities: extremities normal, atraumatic, no cyanosis or edema Pulses: 2+ and symmetric Skin: Skin color, texture, turgor normal. No rashes or lesions Lymph nodes: Cervical, supraclavicular, and axillary nodes normal. Neurologic: Alert and oriented X 3, normal strength and tone. Normal symmetric reflexes. Normal coordination and gait  PCardiographics ECG: normal sinus rhythm, no blocks or conduction defects, no ischemic changes Echocardiogram: not done   Lab Review      Assessment:      68 y.o. female with planned surgery as above.   Known risk factors for perioperative complications: None   Difficulty with intubation is  not anticipated.  Cardiac Risk Estimation: low  Current medications which may produce withdrawal symptoms if withheld perioperatively: n/a    Plan:    1. Preoperative workup as follows ECG, hemoglobin, hematocrit, electrolytes, creatinine, glucose, liver function studies. 2. Change in medication regimen before surgery: ---no meds  3. Prophylaxis for cardiac events with perioperative beta-blockers: not indicated. 4. Invasive hemodynamic monitoring perioperatively: not indicated. 5. Deep vein thrombosis prophylaxis postoperatively:regimen to be chosen by surgical team. 6. Surveillance for postoperative MI with ECG immediately postoperatively and on postoperative days 1 and 2 AND troponin levels 24 hours postoperatively and on day 4 or hospital discharge (whichever comes first): not indicated.9   Assessment and Plan    Endometrial Cancer Scheduled for a hysterectomy on July 05, 2023, which will include the removal of the uterus, ovaries, and lymph nodes. Initially diagnosed due to bleeding, but no bleeding has occurred in the last two days. Preoperative blood work is complete, with blood type A positive. There are no significant comorbidities. Consent for potential blood transfusion is signed. Surgical risks, including bleeding and transfusion, have been discussed. She prefers to proceed despite these risks. Proceed with the hysterectomy as planned. Monitor for postoperative bleeding and ensure blood is available for transfusion if necessary. Follow up postoperatively as scheduled.  General Health Maintenance Generally healthy with no significant comorbidities and normal blood pressure. Preoperative labs are complete. Continue routine health maintenance and monitor blood pressure regularly.

## 2023-07-05 ENCOUNTER — Other Ambulatory Visit: Payer: Self-pay

## 2023-07-05 ENCOUNTER — Ambulatory Visit (HOSPITAL_COMMUNITY): Payer: Medicare Other | Admitting: Anesthesiology

## 2023-07-05 ENCOUNTER — Ambulatory Visit (HOSPITAL_BASED_OUTPATIENT_CLINIC_OR_DEPARTMENT_OTHER): Payer: Medicare Other | Admitting: Anesthesiology

## 2023-07-05 ENCOUNTER — Encounter (HOSPITAL_COMMUNITY)
Admission: RE | Disposition: A | Discharge status: Home / Self Care | Payer: Self-pay | Source: Home / Self Care | Attending: Gynecologic Oncology

## 2023-07-05 ENCOUNTER — Ambulatory Visit (HOSPITAL_COMMUNITY)
Admission: RE | Admit: 2023-07-05 | Discharge: 2023-07-05 | Disposition: A | Discharge status: Home / Self Care | Payer: Medicare Other | Attending: Gynecologic Oncology | Admitting: Gynecologic Oncology

## 2023-07-05 ENCOUNTER — Encounter (HOSPITAL_COMMUNITY): Payer: Self-pay | Admitting: Gynecologic Oncology

## 2023-07-05 DIAGNOSIS — D259 Leiomyoma of uterus, unspecified: Secondary | ICD-10-CM | POA: Insufficient documentation

## 2023-07-05 DIAGNOSIS — C541 Malignant neoplasm of endometrium: Secondary | ICD-10-CM

## 2023-07-05 DIAGNOSIS — Z6834 Body mass index (BMI) 34.0-34.9, adult: Secondary | ICD-10-CM | POA: Insufficient documentation

## 2023-07-05 DIAGNOSIS — N950 Postmenopausal bleeding: Secondary | ICD-10-CM | POA: Insufficient documentation

## 2023-07-05 DIAGNOSIS — E669 Obesity, unspecified: Secondary | ICD-10-CM | POA: Diagnosis not present

## 2023-07-05 DIAGNOSIS — N736 Female pelvic peritoneal adhesions (postinfective): Secondary | ICD-10-CM | POA: Diagnosis not present

## 2023-07-05 DIAGNOSIS — G709 Myoneural disorder, unspecified: Secondary | ICD-10-CM | POA: Diagnosis not present

## 2023-07-05 HISTORY — PX: ROBOTIC ASSISTED TOTAL HYSTERECTOMY WITH BILATERAL SALPINGO OOPHERECTOMY: SHX6086

## 2023-07-05 HISTORY — PX: SENTINEL NODE BIOPSY: SHX6608

## 2023-07-05 LAB — TYPE AND SCREEN
ABO/RH(D): A POS
Antibody Screen: NEGATIVE

## 2023-07-05 SURGERY — HYSTERECTOMY, TOTAL, ROBOT-ASSISTED, LAPAROSCOPIC, WITH BILATERAL SALPINGO-OOPHORECTOMY
Anesthesia: General

## 2023-07-05 MED ORDER — BUPIVACAINE HCL 0.25 % IJ SOLN
INTRAMUSCULAR | Status: DC | PRN
  Administered 2023-07-05: 50 mL

## 2023-07-05 MED ORDER — SUGAMMADEX SODIUM 200 MG/2ML IV SOLN
INTRAVENOUS | Status: DC | PRN
  Administered 2023-07-05: 200 mg via INTRAVENOUS

## 2023-07-05 MED ORDER — LIDOCAINE HCL (PF) 2 % IJ SOLN
INTRAMUSCULAR | Status: AC
Start: 2023-07-05 — End: ?
  Filled 2023-07-05: qty 5

## 2023-07-05 MED ORDER — DEXAMETHASONE SODIUM PHOSPHATE 10 MG/ML IJ SOLN
INTRAMUSCULAR | Status: AC
  Filled 2023-07-05: qty 1

## 2023-07-05 MED ORDER — SODIUM CHLORIDE (PF) 0.9 % IJ SOLN
INTRAMUSCULAR | Status: AC
  Filled 2023-07-05: qty 10

## 2023-07-05 MED ORDER — HEPARIN SODIUM (PORCINE) 5000 UNIT/ML IJ SOLN
5000.0000 [IU] | INTRAMUSCULAR | Status: AC
  Administered 2023-07-05: 5000 [IU] via SUBCUTANEOUS
  Filled 2023-07-05: qty 1

## 2023-07-05 MED ORDER — ACETAMINOPHEN 500 MG PO TABS
1000.0000 mg | ORAL_TABLET | ORAL | Status: AC
  Administered 2023-07-05: 1000 mg via ORAL
  Filled 2023-07-05: qty 2

## 2023-07-05 MED ORDER — EPHEDRINE 5 MG/ML INJ
INTRAVENOUS | Status: AC
  Filled 2023-07-05: qty 5

## 2023-07-05 MED ORDER — BUPIVACAINE LIPOSOME 1.3 % IJ SUSP
INTRAMUSCULAR | Status: DC | PRN
  Administered 2023-07-05: 20 mL

## 2023-07-05 MED ORDER — OXYCODONE HCL 5 MG PO TABS
5.0000 mg | ORAL_TABLET | Freq: Once | ORAL | Status: AC | PRN
  Administered 2023-07-05: 5 mg via ORAL

## 2023-07-05 MED ORDER — DEXMEDETOMIDINE HCL IN NACL 80 MCG/20ML IV SOLN
INTRAVENOUS | Status: DC | PRN
  Administered 2023-07-05: 8 ug via INTRAVENOUS

## 2023-07-05 MED ORDER — PROPOFOL 10 MG/ML IV BOLUS
INTRAVENOUS | Status: DC | PRN
  Administered 2023-07-05: 150 mg via INTRAVENOUS

## 2023-07-05 MED ORDER — DEXAMETHASONE SODIUM PHOSPHATE 4 MG/ML IJ SOLN
4.0000 mg | INTRAMUSCULAR | Status: AC
  Administered 2023-07-05: 8 mg via INTRAVENOUS

## 2023-07-05 MED ORDER — OXYCODONE HCL 5 MG/5ML PO SOLN
5.0000 mg | Freq: Once | ORAL | Status: AC | PRN
Start: 2023-07-05 — End: 2023-07-05

## 2023-07-05 MED ORDER — MIDAZOLAM HCL 2 MG/2ML IJ SOLN
INTRAMUSCULAR | Status: AC
  Filled 2023-07-05: qty 2

## 2023-07-05 MED ORDER — STERILE WATER FOR INJECTION IJ SOLN
INTRAMUSCULAR | Status: AC
  Filled 2023-07-05: qty 10

## 2023-07-05 MED ORDER — KETAMINE HCL 50 MG/5ML IJ SOSY
PREFILLED_SYRINGE | INTRAMUSCULAR | Status: AC
Start: 2023-07-05 — End: ?
  Filled 2023-07-05: qty 5

## 2023-07-05 MED ORDER — BUPIVACAINE LIPOSOME 1.3 % IJ SUSP
INTRAMUSCULAR | Status: AC
  Filled 2023-07-05: qty 20

## 2023-07-05 MED ORDER — HYDROMORPHONE HCL 2 MG/ML IJ SOLN
INTRAMUSCULAR | Status: AC
  Filled 2023-07-05: qty 1

## 2023-07-05 MED ORDER — CEFAZOLIN SODIUM-DEXTROSE 2-4 GM/100ML-% IV SOLN
2.0000 g | INTRAVENOUS | Status: AC
  Administered 2023-07-05: 2 g via INTRAVENOUS
  Filled 2023-07-05: qty 100

## 2023-07-05 MED ORDER — MIDAZOLAM HCL 5 MG/5ML IJ SOLN
INTRAMUSCULAR | Status: DC | PRN
  Administered 2023-07-05: 2 mg via INTRAVENOUS

## 2023-07-05 MED ORDER — ONDANSETRON HCL 4 MG/2ML IJ SOLN
INTRAMUSCULAR | Status: AC
Start: 2023-07-05 — End: ?
  Filled 2023-07-05: qty 2

## 2023-07-05 MED ORDER — LIDOCAINE HCL (CARDIAC) PF 100 MG/5ML IV SOSY
PREFILLED_SYRINGE | INTRAVENOUS | Status: DC | PRN
  Administered 2023-07-05: 50 mg via INTRAVENOUS

## 2023-07-05 MED ORDER — PHENYLEPHRINE 80 MCG/ML (10ML) SYRINGE FOR IV PUSH (FOR BLOOD PRESSURE SUPPORT)
PREFILLED_SYRINGE | INTRAVENOUS | Status: AC
  Filled 2023-07-05: qty 10

## 2023-07-05 MED ORDER — ROCURONIUM BROMIDE 10 MG/ML (PF) SYRINGE
PREFILLED_SYRINGE | INTRAVENOUS | Status: AC
  Filled 2023-07-05: qty 10

## 2023-07-05 MED ORDER — KETAMINE HCL 10 MG/ML IJ SOLN
INTRAMUSCULAR | Status: DC | PRN
  Administered 2023-07-05: 50 mg via INTRAVENOUS

## 2023-07-05 MED ORDER — PROPOFOL 10 MG/ML IV BOLUS
INTRAVENOUS | Status: AC
  Filled 2023-07-05: qty 20

## 2023-07-05 MED ORDER — EPHEDRINE SULFATE-NACL 50-0.9 MG/10ML-% IV SOSY
PREFILLED_SYRINGE | INTRAVENOUS | Status: DC | PRN
  Administered 2023-07-05 (×4): 5 mg via INTRAVENOUS

## 2023-07-05 MED ORDER — BUPIVACAINE HCL 0.25 % IJ SOLN
INTRAMUSCULAR | Status: AC
  Filled 2023-07-05: qty 1

## 2023-07-05 MED ORDER — ROCURONIUM BROMIDE 100 MG/10ML IV SOLN
INTRAVENOUS | Status: DC | PRN
  Administered 2023-07-05: 20 mg via INTRAVENOUS
  Administered 2023-07-05: 80 mg via INTRAVENOUS

## 2023-07-05 MED ORDER — CHLORHEXIDINE GLUCONATE 0.12 % MT SOLN
15.0000 mL | Freq: Once | OROMUCOSAL | Status: AC
  Administered 2023-07-05: 15 mL via OROMUCOSAL

## 2023-07-05 MED ORDER — ONDANSETRON HCL 4 MG/2ML IJ SOLN
INTRAMUSCULAR | Status: DC | PRN
  Administered 2023-07-05: 4 mg via INTRAVENOUS

## 2023-07-05 MED ORDER — OXYCODONE HCL 5 MG PO TABS
ORAL_TABLET | ORAL | Status: AC
  Filled 2023-07-05: qty 1

## 2023-07-05 MED ORDER — FENTANYL CITRATE (PF) 100 MCG/2ML IJ SOLN
INTRAMUSCULAR | Status: AC
  Filled 2023-07-05: qty 2

## 2023-07-05 MED ORDER — PHENYLEPHRINE HCL (PRESSORS) 10 MG/ML IV SOLN
INTRAVENOUS | Status: DC | PRN
  Administered 2023-07-05 (×3): 80 ug via INTRAVENOUS

## 2023-07-05 MED ORDER — ONDANSETRON HCL 4 MG/2ML IJ SOLN
4.0000 mg | Freq: Once | INTRAMUSCULAR | Status: DC | PRN
Start: 2023-07-05 — End: 2023-07-05

## 2023-07-05 MED ORDER — FENTANYL CITRATE (PF) 100 MCG/2ML IJ SOLN
INTRAMUSCULAR | Status: DC | PRN
  Administered 2023-07-05 (×4): 25 ug via INTRAVENOUS

## 2023-07-05 MED ORDER — LACTATED RINGERS IR SOLN
Status: DC | PRN
  Administered 2023-07-05: 1000 mL

## 2023-07-05 MED ORDER — HYDROMORPHONE HCL 1 MG/ML IJ SOLN
INTRAMUSCULAR | Status: DC | PRN
  Administered 2023-07-05: .4 mg via INTRAVENOUS
  Administered 2023-07-05 (×3): .2 mg via INTRAVENOUS

## 2023-07-05 MED ORDER — LACTATED RINGERS IV SOLN: INTRAVENOUS | Status: DC

## 2023-07-05 MED ORDER — HYDROMORPHONE HCL 1 MG/ML IJ SOLN
INTRAMUSCULAR | Status: AC
  Administered 2023-07-05: 0.5 mg via INTRAVENOUS
  Filled 2023-07-05: qty 1

## 2023-07-05 MED ORDER — ORAL CARE MOUTH RINSE: 15.0000 mL | Freq: Once | OROMUCOSAL | Status: AC

## 2023-07-05 MED ORDER — METRONIDAZOLE 500 MG/100ML IV SOLN
500.0000 mg | INTRAVENOUS | Status: AC
Start: 2023-07-05 — End: 2023-07-05
  Administered 2023-07-05: 500 mg via INTRAVENOUS
  Filled 2023-07-05: qty 100

## 2023-07-05 MED ORDER — HYDROMORPHONE HCL 1 MG/ML IJ SOLN
0.2500 mg | INTRAMUSCULAR | Status: DC | PRN
  Administered 2023-07-05: 0.5 mg via INTRAVENOUS

## 2023-07-05 MED ORDER — DEXMEDETOMIDINE HCL IN NACL 80 MCG/20ML IV SOLN
INTRAVENOUS | Status: AC
  Filled 2023-07-05: qty 20

## 2023-07-05 MED ORDER — ALBUMIN HUMAN 5 % IV SOLN
INTRAVENOUS | Status: AC
  Filled 2023-07-05: qty 250

## 2023-07-05 SURGICAL SUPPLY — 74 items
APPLICATOR SURGIFLO ENDO (HEMOSTASIS) IMPLANT
BAG COUNTER SPONGE SURGICOUNT (BAG) IMPLANT
BAG LAPAROSCOPIC 12 15 PORT 16 (BASKET) IMPLANT
BAG RETRIEVAL 12/15 (BASKET) ×2
BINDER ABDOMINAL 12 ML 46-62 (SOFTGOODS) IMPLANT
BLADE SURG SZ10 CARB STEEL (BLADE) IMPLANT
COVER BACK TABLE 60X90IN (DRAPES) ×2 IMPLANT
COVER TIP SHEARS 8 DVNC (MISCELLANEOUS) ×2 IMPLANT
DERMABOND ADVANCED .7 DNX12 (GAUZE/BANDAGES/DRESSINGS) ×2 IMPLANT
DRAPE ARM DVNC X/XI (DISPOSABLE) ×8 IMPLANT
DRAPE COLUMN DVNC XI (DISPOSABLE) ×2 IMPLANT
DRAPE SHEET LG 3/4 BI-LAMINATE (DRAPES) ×2 IMPLANT
DRAPE SURG IRRIG POUCH 19X23 (DRAPES) ×2 IMPLANT
DRIVER NDL MEGA SUTCUT DVNCXI (INSTRUMENTS) ×2 IMPLANT
DRIVER NDLE MEGA SUTCUT DVNCXI (INSTRUMENTS) ×2
DRSG OPSITE POSTOP 4X6 (GAUZE/BANDAGES/DRESSINGS) IMPLANT
DRSG OPSITE POSTOP 4X8 (GAUZE/BANDAGES/DRESSINGS) IMPLANT
ELECT PENCIL ROCKER SW 15FT (MISCELLANEOUS) IMPLANT
ELECT REM PT RETURN 15FT ADLT (MISCELLANEOUS) ×2 IMPLANT
FORCEPS BPLR FENES DVNC XI (FORCEP) ×2 IMPLANT
FORCEPS PROGRASP DVNC XI (FORCEP) ×2 IMPLANT
GAUZE 4X4 16PLY ~~LOC~~+RFID DBL (SPONGE) ×4 IMPLANT
GLOVE BIO SURGEON STRL SZ 6 (GLOVE) ×8 IMPLANT
GLOVE BIO SURGEON STRL SZ 6.5 (GLOVE) ×2 IMPLANT
GOWN STRL REUS W/ TWL LRG LVL3 (GOWN DISPOSABLE) ×8 IMPLANT
GRASPER SUT TROCAR 14GX15 (MISCELLANEOUS) IMPLANT
HEMOSTAT ARISTA ABSORB 3G PWDR (HEMOSTASIS) IMPLANT
HOLDER FOLEY CATH W/STRAP (MISCELLANEOUS) IMPLANT
IRRIG SUCT STRYKERFLOW 2 WTIP (MISCELLANEOUS) ×2
IRRIGATION SUCT STRKRFLW 2 WTP (MISCELLANEOUS) ×2 IMPLANT
KIT PROCEDURE DVNC SI (MISCELLANEOUS) IMPLANT
KIT TURNOVER KIT A (KITS) IMPLANT
LIGASURE IMPACT 36 18CM CVD LR (INSTRUMENTS) IMPLANT
MANIPULATOR ADVINCU DEL 3.0 PL (MISCELLANEOUS) IMPLANT
MANIPULATOR ADVINCU DEL 3.5 PL (MISCELLANEOUS) IMPLANT
MANIPULATOR UTERINE 4.5 ZUMI (MISCELLANEOUS) IMPLANT
NDL HYPO 21X1.5 SAFETY (NEEDLE) ×2 IMPLANT
NDL SPNL 18GX3.5 QUINCKE PK (NEEDLE) IMPLANT
NEEDLE HYPO 21X1.5 SAFETY (NEEDLE) ×2
NEEDLE SPNL 18GX3.5 QUINCKE PK (NEEDLE) ×2
OBTURATOR OPTICAL STND 8 DVNC (TROCAR) ×2
OBTURATOR OPTICALSTD 8 DVNC (TROCAR) ×2 IMPLANT
PACK ROBOT GYN CUSTOM WL (TRAY / TRAY PROCEDURE) ×2 IMPLANT
PAD POSITIONING PINK XL (MISCELLANEOUS) ×2 IMPLANT
PORT ACCESS TROCAR AIRSEAL 12 (TROCAR) IMPLANT
SCISSORS MNPLR CVD DVNC XI (INSTRUMENTS) ×2 IMPLANT
SCRUB CHG 4% DYNA-HEX 4OZ (MISCELLANEOUS) IMPLANT
SEAL UNIV 5-12 XI (MISCELLANEOUS) ×8 IMPLANT
SET TRI-LUMEN FLTR TB AIRSEAL (TUBING) ×2 IMPLANT
SPIKE FLUID TRANSFER (MISCELLANEOUS) ×2 IMPLANT
SPONGE T-LAP 18X18 ~~LOC~~+RFID (SPONGE) IMPLANT
SURGIFLO W/THROMBIN 8M KIT (HEMOSTASIS) IMPLANT
SUT MNCRL AB 4-0 PS2 18 (SUTURE) IMPLANT
SUT PDS AB 1 TP1 96 (SUTURE) IMPLANT
SUT V-LOC 180 0-0 GS22 (SUTURE) IMPLANT
SUT VIC AB 0 CT1 27XBRD ANTBC (SUTURE) IMPLANT
SUT VIC AB 2-0 CT1 TAPERPNT 27 (SUTURE) IMPLANT
SUT VIC AB 4-0 PS2 18 (SUTURE) ×4 IMPLANT
SUT VICRYL 0 27 CT2 27 ABS (SUTURE) ×2 IMPLANT
SUT VLOC 180 0 9IN GS21 (SUTURE) IMPLANT
SYR 10ML LL (SYRINGE) IMPLANT
SYS BAG RETRIEVAL 10MM (BASKET)
SYS RETRIEVAL 5MM INZII UNIV (BASKET) ×2
SYS WOUND ALEXIS 18CM MED (MISCELLANEOUS)
SYSTEM BAG RETRIEVAL 10MM (BASKET) IMPLANT
SYSTEM RETRIEVL 5MM INZII UNIV (BASKET) IMPLANT
SYSTEM WOUND ALEXIS 18CM MED (MISCELLANEOUS) IMPLANT
TRAP SPECIMEN MUCUS 40CC (MISCELLANEOUS) IMPLANT
TRAY FOLEY MTR SLVR 16FR STAT (SET/KITS/TRAYS/PACK) ×2 IMPLANT
TROCAR PORT AIRSEAL 5X120 (TROCAR) IMPLANT
UNDERPAD 30X36 HEAVY ABSORB (UNDERPADS AND DIAPERS) ×4 IMPLANT
WATER STERILE IRR 1000ML POUR (IV SOLUTION) ×2 IMPLANT
WATER STERILE IRR 500ML POUR (IV SOLUTION) IMPLANT
YANKAUER SUCT BULB TIP 10FT TU (MISCELLANEOUS) IMPLANT

## 2023-07-05 NOTE — Op Note (Signed)
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   Anesthesia: GET  Urine Output: 100 cc of concentrated appearing urine  Operative Findings: On EUA, enlarged mobile uterus. 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 Normal appearing omentum, small and large bowel, appendix. Uterus 12-14 cm with large fundal fibroid. Normal appearing bilateral adnexa with some filmy adhesions from the left ovary to the sigmoid mesentery and posterior uterus. Mapping successful to left obturator, right external iliac, and right obturator (non-green lymph node but two green channels let to this node). No ascites. No obvious adenopathy or evidence of extra-uterine disease.   Estimated Blood Loss:  100 cc      Total IV Fluids: see I&O flowsheet         Specimens: uterus, cervix, bilateral tubes and ovaries, bilateral obturator SLNs (right was not green but two green channels led to this node), right external iliac SLN.         Complications:  None apparent; patient tolerated the procedure well.         Disposition: PACU - hemodynamically stable.  Procedure Details  The patient was seen in the Holding Room. The risks, benefits, complications, treatment options, and expected outcomes were discussed with the patient.  The patient concurred with the proposed plan, giving informed consent.  The site of surgery properly noted/marked. The patient was  identified as Elizabeth [name] and the procedure verified as a Robotic-assisted hysterectomy with bilateral salpingo oophorectomy with SLN biopsy.   After induction of anesthesia, the patient was draped and prepped in the usual sterile manner. Patient was placed in supine position after anesthesia and draped and prepped in the usual sterile manner as follows: Her arms were tucked to her side with all appropriate precautions.  The patient was secured to the bed using padding and tape across her chest.  The patient was placed in the semi-lithotomy position in Lyons stirrups.  The perineum and vagina were prepped with CHG. The patient's abdomen was prepped with ChloraPrep and she was draped after the prep had been allowed to dry for 3 minutes.  A Time Out was held and the above information confirmed.  The urethra was prepped with Betadine. Foley catheter was placed.  A sterile speculum was placed in the vagina.  The cervix was grasped with a single-tooth tenaculum. 2mg  total of ICG was injected into the cervical stroma at 2 and 9 o'clock with 1cc injected at a 1cm and 2mm depth (concentration 0.5mg /ml) in all locations. The cervix was dilated with Shawnie Pons dilators.  The ZUMI uterine manipulator with a medium colpotomizer ring was placed without difficulty.  A pneum occluder balloon was placed over the manipulator.  OG tube placement was confirmed and to suction.   Next, a 10 mm skin incision was made 1 cm below the subcostal margin in the midclavicular line.  The 5 mm Optiview port and scope was used for direct entry.  Opening pressure was under 10 mm CO2.  The abdomen was insufflated and the findings were noted as above.   At this point and  all points during the procedure, the patient's intra-abdominal pressure did not exceed 15 mmHg. Next, an 8 mm skin incision was made superior to the umbilicus and a right and left port were placed about 8 cm lateral to the robot port on the right and left side.  A fourth arm was  placed on the right.  The 5 mm assist trocar was exchanged for a 10-12 mm port. All ports were placed under direct visualization.  The patient was placed in steep Trendelenburg.  Bowel was folded away into the upper abdomen.  The robot was docked in the normal manner.  The right and left peritoneum were opened parallel to the IP ligament to open the retroperitoneal spaces bilaterally. The round ligaments were transected. The SLN mapping was performed in bilateral pelvic basins. After identifying the ureters, the para rectal and paravesical spaces were opened up entirely with careful dissection below the level of the ureters bilaterally and to the depth of the uterine artery origin in order to skeletonize the uterine "web" and ensure visualization of all parametrial channels. The para-aortic basins were carefully exposed and evaluated for isolated para-aortic SLN's. Lymphatic channels were identified travelling to the following visualized sentinel lymph nodes: left obturator, right obturator (non-green SLN), and right external iliac. These SLNs were separated from their surrounding lymphatic tissue, removed and sent for permanent pathology.  The hysterectomy was started.  The ureter was again noted to be on the medial leaf of the broad ligament.  The peritoneum above the ureter was incised and stretched and the infundibulopelvic ligament was skeletonized, cauterized and cut.  The posterior peritoneum was taken down to the level of the KOH ring.  The anterior peritoneum was also taken down.  The bladder flap was created to the level of the KOH ring.  The uterine artery on the right side was skeletonized, cauterized and cut in the normal manner.  A similar procedure was performed on the left.  On the left, there ureter was pulled superiorly, closer to the cervix within the parametria. Care was taken to visualize the ureter and cauterize the uterine artery superiorly and medially. Once cut, the uterine artery  pedicle and thus ureter were lateralized. The colpotomy was made and the uterus, cervix, bilateral ovaries and tubes were amputated and placed in an Endocatch bag inserted through the assist trocar.  Pedicles were inspected and excellent hemostasis was achieved.    The colpotomy at the vaginal cuff was closed with 0 Vicryl with a figure of eight at each apex and 0 V-Loc to close the midportion of the cuff in a running manner.  Irrigation was used and excellent hemostasis was achieved.  Arista was placed within the surgical beds of the nodal basins. At this point in the procedure was completed.  Robotic instruments were removed under direct visulaization.  The robot was undocked.   The fascia at the 10-12 mm port was closed with 0 Vicryl using a PMI fascial closure device under direct visualization.  The supraumbilical trocar was removed and the incision extended 8-10 cm with a scalpel. The incision was carried down to and through the fascia, with the abdomen insufflated, using monopolar electrocautery. The peritoneal incision was extended under direct visualization. The endocatch bag with the uterine specimen was delivered through the incision. The incision was then closed with running #1 looped PDS tied in the midline. The subcutaneous tissue was irrigated and hemostasis achieved. Exparel was injected for local anesthesia. The subcutaneous tissue was closed with 2-0 Vicyrl  in running fashion.   The subcuticular tissue of all incisions was closed with 4-0 Vicryl and the skin was closed with 4-0 Monocryl in a subcuticular manner.  Dermabond was applied.    The vagina was swabbed with  minimal bleeding noted. Foley catheter was removed  All sponge, lap and needle counts were correct x  3.   The patient was transferred to the recovery room in stable condition.  Eugene Garnet, MD

## 2023-07-05 NOTE — Discharge Instructions (Addendum)
 AFTER SURGERY INSTRUCTIONS   Return to work: 4-6 weeks if applicable  Today, there was a sentinel lymph node removed from the left and right pelvis. You do have dissolvable stitches at the entrance of the vagina. You can use ice to the vulva for any discomfort in this area.   We recommend purchasing several bags of frozen green peas and dividing them into ziploc bags. You will want to keep these in the freezer and have them ready to use as ice packs to the vaginal entrance/vulva where stitches are present. Once the ice pack is no longer cold, you can get another from the freezer. The frozen peas mold to your body better than a regular ice pack.   You can resume your baby aspirin starting tomorrow.   Activity: 1. Be up and out of the bed during the day.  Take a nap if needed.  You may walk up steps but be careful and use the hand rail.  Stair climbing will tire you more than you think, you may need to stop part way and rest.    2. No lifting or straining for 6 weeks over 10 pounds. No pushing, pulling, straining for 6 weeks.   3. No driving for 1-61 days when the following criteria have been met: Do not drive if you are taking narcotic pain medicine and make sure that your reaction time has returned.    4. You can shower as soon as the next day after surgery. Shower daily.  Use your regular soap and water (not directly on the incision) and pat your incision(s) dry afterwards; don't rub.  No tub baths or submerging your body in water until cleared by your surgeon. If you have the soap that was given to you by pre-surgical testing that was used before surgery, you do not need to use it afterwards because this can irritate your incisions.    5. No sexual activity and nothing in the vagina for 10-12 weeks.   6. You may experience a small amount of clear drainage from your incisions, which is normal.  If the drainage persists, increases, or changes color please call the office.   7. Do not use  creams, lotions, or ointments such as neosporin on your incisions after surgery until advised by your surgeon because they can cause removal of the dermabond glue on your incisions.     8. You may experience vaginal spotting after surgery or when the stitches at the top of the vagina begin to dissolve.  The spotting is normal but if you experience heavy bleeding, call our office.   9. Take Tylenol or ibuprofen first for pain if you are able to take these medications and only use narcotic pain medication for severe pain not relieved by the Tylenol or Ibuprofen.  Monitor your Tylenol intake to a max of 4,000 mg in a 24 hour period. You can alternate these medications after surgery.   Diet: 1. Low sodium Heart Healthy Diet is recommended but you are cleared to resume your normal (before surgery) diet after your procedure.   2. It is safe to use a laxative, such as Miralax or Colace, if you have difficulty moving your bowels before surgery. You have been prescribed Sennakot-S to take at bedtime every evening after surgery to keep bowel movements regular and to prevent constipation.     Wound Care: 1. Keep clean and dry.  Shower daily.   Reasons to call the Doctor: Fever - Oral temperature greater than  100.4 degrees Fahrenheit Foul-smelling vaginal discharge Difficulty urinating Nausea and vomiting Increased pain at the site of the incision that is unrelieved with pain medicine. Difficulty breathing with or without chest pain New calf pain especially if only on one side Sudden, continuing increased vaginal bleeding with or without clots.   Contacts: For questions or concerns you should contact:   Dr. Eugene Garnet at [phone]   Warner Mccreedy, NP at [phone]   After Hours: call [phone] and have the GYN Oncologist paged/contacted (after 5 pm or on the weekends). You will speak with an after hours RN and let he or she know you have had surgery.   Messages sent via mychart are  for non-urgent matters and are not responded to after hours so for urgent needs, please call the after hours number.

## 2023-07-05 NOTE — Telephone Encounter (Signed)
Received clearance

## 2023-07-05 NOTE — Anesthesia Procedure Notes (Signed)
Procedure Name: Intubation Date/Time: 07/05/2023 8:03 AM  Performed by: Lezlie Lye, CRNAPre-anesthesia Checklist: Patient identified, Emergency Drugs available, Suction available and Patient being monitored Patient Re-evaluated:Patient Re-evaluated prior to induction Oxygen Delivery Method: Circle system utilized Preoxygenation: Pre-oxygenation with 100% oxygen Induction Type: IV induction Ventilation: Mask ventilation without difficulty Laryngoscope Size: Miller and 3 Grade View: Grade I Tube type: Oral Tube size: 7.0 mm Number of attempts: 1 Airway Equipment and Method: Stylet Placement Confirmation: ETT inserted through vocal cords under direct vision, positive ETCO2 and breath sounds checked- equal and bilateral Secured at: 21 cm Tube secured with: Tape Dental Injury: Teeth and Oropharynx as per pre-operative assessment

## 2023-07-05 NOTE — Interval H&P Note (Signed)
History and Physical Interval Note:  07/05/2023 6:46 AM  Elizabeth [name]  has presented today for surgery, with the diagnosis of ENDOMETRIAL CANCER.  The various methods of treatment have been discussed with the patient and family. After consideration of risks, benefits and other options for treatment, the patient has consented to  Procedure(s): XI ROBOTIC ASSISTED TOTAL HYSTERECTOMY WITH BILATERAL SALPINGO OOPHORECTOMY WITH POSSIBLE LAPAROTOMY (N/A) SENTINEL NODE BIOPSY (N/A) POSSIBLE LYMPH NODE DISSECTION (N/A) as a surgical intervention.  The patient's history has been reviewed, patient examined, no change in status, stable for surgery.  I have reviewed the patient's chart and labs.  Questions were answered to the patient's satisfaction.     Carver Fila

## 2023-07-05 NOTE — Anesthesia Postprocedure Evaluation (Signed)
Anesthesia Post Note  Patient: Elizabeth [name]  Procedure(s) Performed: XI ROBOTIC ASSISTED TOTAL HYSTERECTOMY WITH BILATERAL SALPINGO OOPHORECTOMY WITH LAPAROTOMY, UTERUS GREATER THAN 250 GRAMS (Bilateral) SENTINEL NODE BIOPSY     Patient location during evaluation: PACU Anesthesia Type: General Level of consciousness: awake and alert and oriented Pain management: pain level controlled Vital Signs Assessment: post-procedure vital signs reviewed and stable Respiratory status: spontaneous breathing, nonlabored ventilation and respiratory function stable Cardiovascular status: blood pressure returned to baseline and stable Postop Assessment: no apparent nausea or vomiting Anesthetic complications: no   No notable events documented.  Last Vitals:  Vitals:   07/05/23 1202 07/05/23 1230  BP: (!) 100/49   Pulse: 69   Resp: 18 12  Temp:  (!) 35.9 C  SpO2: 95% (!) 87%    Last Pain:  Vitals:   07/05/23 1230  TempSrc:   PainSc: 0-No pain                 Kristof Nadeem A.

## 2023-07-05 NOTE — Transfer of Care (Signed)
Immediate Anesthesia Transfer of Care Note  Patient: Elizabeth [name]  Procedure(s) Performed: XI ROBOTIC ASSISTED TOTAL HYSTERECTOMY WITH BILATERAL SALPINGO OOPHORECTOMY WITH LAPAROTOMY, UTERUS GREATER THAN 250 GRAMS (Bilateral) SENTINEL NODE BIOPSY  Patient Location: PACU  Anesthesia Type:General  Level of Consciousness: drowsy  Airway & Oxygen Therapy: Patient Spontanous Breathing and Patient connected to face mask oxygen  Post-op Assessment: Report given to RN and Post -op Vital signs reviewed and stable  Post vital signs: Reviewed and stable  Last Vitals:  Vitals Value Taken Time  BP 120/69 07/05/23 1106  Temp    Pulse 76 07/05/23 1109  Resp 12   SpO2 100 % 07/05/23 1109  Vitals shown include unfiled device data.  Last Pain:  Vitals:   07/05/23 0640  TempSrc:   PainSc: 0-No pain         Complications: No notable events documented.

## 2023-07-06 ENCOUNTER — Encounter (HOSPITAL_COMMUNITY): Payer: Self-pay | Admitting: Gynecologic Oncology

## 2023-07-06 ENCOUNTER — Telehealth: Payer: Self-pay | Admitting: *Deleted

## 2023-07-06 NOTE — Telephone Encounter (Signed)
Spoke with Elizabeth [name] this morning. She states she is eating, drinking and urinating well. She has not had a BM yet and is passing little gas. Pt encouraged to ambulate, drink hot tea and she could try simethicone over the counter if needed.  Pt reports she hasn't had a lot to eat as of yet, but feels once she starts eating the gas will come. Pt denies abd. distention and gas pain. She is taking senokot as prescribed and encouraged her to drink plenty of water. She denies fever or chills. Incisions are dry and intact. She rates her pain 5/10. Her pain is controlled with tylenol today and she only took one oxycodone last night.     Instructed to call office with any fever, chills, purulent drainage, uncontrolled pain or any other questions or concerns. Patient verbalizes understanding.   Pt aware of post op appointments as well as the office number [phone] and after hours number [phone] to call if she has any questions or concerns

## 2023-07-07 ENCOUNTER — Encounter: Payer: Self-pay | Admitting: Gynecologic Oncology

## 2023-07-07 LAB — SURGICAL PATHOLOGY

## 2023-07-11 ENCOUNTER — Encounter: Payer: Self-pay | Admitting: Gynecologic Oncology

## 2023-07-11 ENCOUNTER — Inpatient Hospital Stay: Payer: Medicare Other | Attending: Gynecologic Oncology | Admitting: Gynecologic Oncology

## 2023-07-11 DIAGNOSIS — Z7189 Other specified counseling: Secondary | ICD-10-CM

## 2023-07-11 DIAGNOSIS — C541 Malignant neoplasm of endometrium: Secondary | ICD-10-CM

## 2023-07-11 NOTE — Progress Notes (Addendum)
Gynecologic Oncology Telehealth Note: Gyn-Onc  I connected with Alison Stalling on 07/11/23 at  6:00 PM EST by telephone and verified that I am speaking with the correct person using two identifiers.  I discussed the limitations, risks, security and privacy concerns of performing an evaluation and management service by telemedicine and the availability of in-person appointments. I also discussed with the patient that there may be a patient responsible charge related to this service. The patient expressed understanding and agreed to proceed.  Other persons participating in the visit and their role in the encounter: patient's daughter.  Patient's location: home Provider's location: [place]  Reason for Visit: follow-up  Treatment History: Oncology History Overview Note  Endometrial biopsy performed on 04/01/2021 reveals rare strips of atrophic endometrium and mucus, extremely scant.  Hyperplasia or malignancy noted. Pelvic ultrasound exam on 11/25/2022 at [facility] shows a thickened endometrium measuring 3.3 mm.  No discrete lesions seen on sonohysterography. Endometrial biopsy performed at the time of the above ultrasound on 6/14 reveals minute fragments of benign endocervical glands, no diagnostic endometrial tissue. Repeat pelvic ultrasound exam on 01/19/2023 reveals a uterus measuring 13.3 x 10.1 x 4.3 cm endometrium measures 8.1 mm, trace free fluid noted in the endometrium.  Multiple fibroids noted, the largest measures up to 5.7 cm.  Ovaries within normal limits. Hysteroscopy with endometrial sampling, incomplete resection of endometrial polyp on 05/26/2023. 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 P53 WT.   Endometrial cancer (HCC)  07/05/2023 Initial Diagnosis   Endometrial cancer (HCC)   07/05/2023 Surgery   Robotic-assisted laparoscopic total hysterectomy with bilateral salpingoophorectomy, SLN biopsy bilaterally   Findings:  On EUA, enlarged mobile uterus. 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 Normal appearing omentum, small and large bowel, appendix. Uterus 12-14 cm with large fundal fibroid. Normal appearing bilateral adnexa with some filmy adhesions from the left ovary to the sigmoid mesentery and posterior uterus. Mapping successful to left obturator, right external iliac, and right obturator (non-green lymph node but two green channels let to this node). No ascites. No obvious adenopathy or evidence of extra-uterine disease.    07/05/2023 Pathology Results   Grade 2 20% MI No LVI, Neg SLNs      Interval History: Doing well. Denies significant pain. Still sore. Denies any vaginal bleeding. Reports good bowel and bladder function. Little weeping from umbilical incision, some bumps/rash around incisions.   Past Medical/Surgical History: Past Medical History:  Diagnosis Date   Acne    Eczema    Endometrial cancer [facility])     Past Surgical History:  Procedure Laterality Date   DILATATION & CURETTAGE/HYSTEROSCOPY WITH MYOSURE N/A 05/26/2023   Procedure: DILATATION & CURETTAGE/HYSTEROSCOPY WITH MYOSURE;  Surgeon: Olivia Mackie, MD;  Location: [facility];  Service: Gynecology;  Laterality: N/A;   ROBOTIC ASSISTED TOTAL HYSTERECTOMY WITH BILATERAL SALPINGO OOPHERECTOMY Bilateral 07/05/2023   Procedure: XI ROBOTIC ASSISTED TOTAL HYSTERECTOMY WITH BILATERAL SALPINGO OOPHORECTOMY WITH LAPAROTOMY, UTERUS GREATER THAN 250 GRAMS;  Surgeon: Carver Fila, MD;  Location: [facility];  Service: Gynecology;  Laterality: Bilateral;   SENTINEL NODE BIOPSY N/A 07/05/2023   Procedure: SENTINEL NODE BIOPSY;  Surgeon: Carver Fila, MD;  Location: [facility];  Service: Gynecology;  Laterality: N/A;   TUBAL LIGATION      Family History  Problem Relation Age of Onset   Hepatitis Mother 87  hep C    Heart disease Father 88       MI   Breast cancer Sister    Cancer Sister 18       breast   Dementia Sister 35   Diabetes Brother    Hyperlipidemia Brother    Hypertension Brother    Aneurysm Maternal Grandfather    Breast cancer Maternal Aunt    Breast cancer Maternal Aunt    Colon cancer Neg Hx    Ovarian cancer Neg Hx    Endometrial cancer Neg Hx    Pancreatic cancer Neg Hx    Prostate cancer Neg Hx     Social History   Socioeconomic History   Marital status: Single    Spouse name: Not on file   Number of children: 1   Years of education: 14   Highest education level: Not on file  Occupational History   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[employer]    Employer: [employer]  Tobacco Use   Smoking status: Never   Smokeless tobacco: Never  Vaping Use   Vaping status: Never Used  Substance and Sexual Activity   Alcohol use: No   Drug use: No   Sexual activity: Never    Partners: Male    Birth control/protection: Surgical  Other Topics Concern   Not on file  Social History Narrative   Exercise--- walk   Social Drivers of Health   Financial Resource Strain: Not on file  Food Insecurity: No Food Insecurity (06/08/2023)   Hunger Vital Sign    Worried About Running Out of Food in the Last Year: Never true    Ran Out of Food in the Last Year: Never true  Transportation Needs: No Transportation Needs (06/08/2023)   PRAPARE - Administrator, Civil Service (Medical): No    Lack of Transportation (Non-Medical): No  Physical Activity: Not on file  Stress: Not on file  Social Connections: Not on file    Current Medications:  Current Outpatient Medications:    Ascorbic Acid (VITAMIN C) 1000 MG tablet, Take 1,000 mg by mouth daily., Disp: , Rfl:    azithromycin (ZITHROMAX Z-PAK) 250 MG tablet, As directed, Disp: 6 each, Rfl: 0   B Complex Vitamins (B COMPLEX 100 PO), Take 1 tablet by mouth daily., Disp: , Rfl:    calcium carbonate (OS-CAL)  600 MG tablet, Take 1,200 mg by mouth daily., Disp: , Rfl:    Cholecalciferol (VITAMIN D) 50 MCG (2000 UT) CAPS, Take 2,000 Units by mouth daily., Disp: , Rfl:    fluticasone (FLONASE) 50 MCG/ACT nasal spray, Place 2 sprays into both nostrils daily., Disp: 16 g, Rfl: 6   Multiple Vitamin (MULTIVITAMIN WITH MINERALS) TABS tablet, Take 1 tablet by mouth daily., Disp: , Rfl:    oxyCODONE (OXY IR/ROXICODONE) 5 MG immediate release tablet, Take 1 tablet (5 mg total) by mouth every 6 (six) hours as needed for severe pain (pain score 7-10)., Disp: 15 tablet, Rfl: 0   oxyCODONE (OXY IR/ROXICODONE) 5 MG immediate release tablet, Take 1 tablet (5 mg total) by mouth every 6 (six) hours as needed for severe pain. (pain score 7-10), Disp: 15 tablet, Rfl: 0   promethazine-dextromethorphan (PROMETHAZINE-DM) 6.25-15 MG/5ML syrup, Take 5 mLs by mouth 4 (four) times daily as needed., Disp: 118 mL, Rfl: 0   senna-docusate (SENOKOT-S) 8.6-50 MG tablet, Take 2 tablets by mouth at bedtime. For AFTER surgery, do not take if having diarrhea, Disp: 30  tablet, Rfl: 0  Review of Symptoms: Pertinent positives as per HPI.  Physical Exam: Deferred given limitations of phone visit.  Laboratory & Radiologic Studies: None new  Assessment & Plan: Elizabeth [name] is a 68 y.o. 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  Doing well. Discussed continued expectations. Reviewed pathology from surgery. Given one HIR risk factor and age, discussed observation is preferred (although could consider VBT).  Plan to have clinic call tomorrow to have her come in for evaluation of rash/umbilical incisions.   I discussed the assessment and treatment plan with the patient. The patient was provided with an opportunity to ask questions and all were answered. The patient agreed with the plan and demonstrated an understanding of the instructions.   The patient was advised to call back or  see an in-person evaluation if the symptoms worsen or if the condition fails to improve as anticipated.   8 minutes of total time was spent for this patient encounter, including preparation, phone counseling with the patient and coordination of care, and documentation of the encounter.   Eugene Garnet, MD  Division of Gynecologic Oncology  Department of Obstetrics and Gynecology  [facility]

## 2023-07-12 ENCOUNTER — Telehealth: Payer: Self-pay | Admitting: *Deleted

## 2023-07-12 ENCOUNTER — Inpatient Hospital Stay: Payer: Medicare Other | Admitting: Obstetrics & Gynecology

## 2023-07-12 ENCOUNTER — Encounter: Payer: Self-pay | Admitting: Obstetrics & Gynecology

## 2023-07-12 VITALS — BP 118/59 | HR 91 | Resp 20 | Wt 171.0 lb

## 2023-07-12 DIAGNOSIS — L244 Irritant contact dermatitis due to drugs in contact with skin: Secondary | ICD-10-CM

## 2023-07-12 DIAGNOSIS — C541 Malignant neoplasm of endometrium: Secondary | ICD-10-CM

## 2023-07-12 MED ORDER — TRIAMCINOLONE ACETONIDE 0.5 % EX CREA: TOPICAL_CREAM | Freq: Two times a day (BID) | CUTANEOUS | 1 refills | Status: AC

## 2023-07-12 MED ORDER — HYDROXYZINE HCL 10 MG PO TABS: 10.0000 mg | ORAL_TABLET | Freq: Two times a day (BID) | ORAL | 0 refills | Status: DC | PRN

## 2023-07-12 NOTE — Telephone Encounter (Signed)
Per Dr Pricilla Holm patient scheduled for an appt today, patient aware

## 2023-07-12 NOTE — Patient Instructions (Signed)
Today we removed the dermabond glue from your incisions. It appears you have a skin reaction to skin adhesive or glue. We will add this to your allergy list.  We have sent in topical steroid cream you will apply to the rash like areas around the incisions and the side of the abdomen twice daily. This is a steroid cream so make sure you wash your hands after application and do not touch your eye with cream on your hand.   We have sent in atarax for itching. Use caution since this pill can cause drowsiness. You can use this twice daily to help with itching.   Plan on changing the opening in the middle abdominal incision at least once daily. You can take a shower and then lightly place the packing strip provided in the opening. This will heal from the inside out. If it falls out you can reapply the packing strip. You may have some drainage from the incisions. You can use a wash cloth or gauze in between the incisions and your pants to help keep your clothes dry.   We will plan on seeing you in the office next week or sooner if needed. Please call our office at [phone] for worsening symptoms, more drainage, increased redness around the incisions, fever, etc, any needs etc.

## 2023-07-12 NOTE — Assessment & Plan Note (Signed)
68 y.o. 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

## 2023-07-12 NOTE — Progress Notes (Signed)
Follow Up Note: Gyn-Onc  Elizabeth [name] 68 y.o. female  CC: Itching,drainage from incisions     History of Present Illness The patient presents 1 wk s/p TRH-BSO, SLN with issues related to a surgical incision, including drainage and irritation.   The supraumbilical surgical incision is sore, with drainage of blood and a clear liquid. She cannot tolerate anything touching it, and the area is breaking out and itching, suggesting a possible reaction to the surgical glue or adhesive used during the procedure. She denies any fevers. She has never had surgical glue applied before and notes a reaction on her thigh as well. The reaction is outlined where the sticky drapes were applied, indicating an allergy to adhesive.     Procedure: Incision Irrigation and Packing Description: The skin adhesive was removed. The incision has a superficial separation at the umbilicus. No induration or purulent drainage noted. Sutures were removed. The incision was irrigated and packed with packing gauze.   Review of Systems  Review of Systems  Constitutional:  Negative for malaise/fatigue and weight loss.  Respiratory:  Negative for shortness of breath and wheezing.   Cardiovascular:  Negative for chest pain and leg swelling.  Gastrointestinal:  Negative for abdominal pain, blood in stool, constipation, nausea and vomiting.  Genitourinary:  Negative for dysuria, frequency, hematuria and urgency.  Musculoskeletal:  Negative for joint pain and myalgias.  Neurological:  Negative for weakness.  Psychiatric/Behavioral:  Negative for depression. The patient does not have insomnia.    Current medications, allergy, social history, past surgical history, past medical history, family history were all reviewed.    Vitals:  BP (!) 118/59 (BP Location: Left Arm, Patient Position: Sitting) Comment: Notified RN  Pulse 91   Resp 20   Wt 171 lb (77.6 kg)   SpO2 98%   BMI 33.40 kg/m    Physical Exam:  Physical  Exam Exam conducted with a chaperone present.  Constitutional:      General: She is not in acute distress. Cardiovascular:     Rate and Rhythm: Normal rate and regular rhythm.  Pulmonary:     Effort: Pulmonary effort is normal.     Breath sounds: Normal breath sounds. No wheezing or rhonchi.  Abdominal:   Incisions: Incision site with superficial separation at umbilicus, no induration or purulent drainage. Erythema and irritation around incision sites and on thigh, consistent with adhesive allergy, outlined by adhesive drape; Satellite maculopapular, erythematous rash    Palpations: Abdomen is soft.     Tenderness: There is no abdominal tenderness. There is no right CVA tenderness or left CVA tenderness.     Hernia: No hernia is present.  Genitourinary:    General: Normal vulva.     Urethra: No urethral lesion.     Vagina: No lesions. No bleeding Musculoskeletal:     Cervical back: Neck supple.     Right lower leg: No edema.     Left lower leg: No edema.  Lymphadenopathy:     Upper Body:     Right upper body: No supraclavicular adenopathy.     Left upper body: No supraclavicular adenopathy.     Lower Body: No right inguinal adenopathy. No left inguinal adenopathy.  Skin:    Findings: No rash.  Neurological:     Mental Status: She is oriented to person, place, and time.   Assessment/Plan:  Postoperative wound complication Superficial separation at the umbilicus with serosanguinous drainage. No signs of infection. Irrigated and packed with gauze. -Return in 1 week  for follow-up. -Reviewed warning signs of infection. -Instructed re: wound care  Allergic contact dermatitis Reaction to surgical adhesive around incisions with pruritus and erythema. -Apply Clobetasol 0.5% cream BID for 1 week. -Use antihistamines as needed for itching. -Add adhesive allergy to medical record.   Antionette Char, MD

## 2023-07-17 LAB — MOLECULAR PATHOLOGY

## 2023-07-18 ENCOUNTER — Inpatient Hospital Stay: Payer: Medicare Other | Attending: Gynecologic Oncology | Admitting: Gynecologic Oncology

## 2023-07-18 VITALS — BP 115/65 | HR 66 | Temp 98.6°F | Resp 16 | Wt 164.0 lb

## 2023-07-18 DIAGNOSIS — L244 Irritant contact dermatitis due to drugs in contact with skin: Secondary | ICD-10-CM

## 2023-07-18 MED ORDER — PREDNISONE 5 MG PO TABS: ORAL_TABLET | ORAL | 0 refills | Status: DC

## 2023-07-18 NOTE — Progress Notes (Signed)
 Gynecologic Oncology Follow Up  Elizabeth [name] 68 y.o. female  CC: Follow up on incisional dermatitis  History of Present Illness The patient is s/p TRH-BSO, SLN with Dr. Comer Dollar with issues related to a surgical incision, including drainage and irritation. She was seen by Dr. Rogelio on 07/12/2023 for abdominal incision evaluation with itching, rash, drainage. She tried topical steroid cream and the atarax  at night with little relief in symptoms. She has changed the packing strip in the small opening in the midline incision with no issues voiced.    Review of Systems  Constitutional:  Negative for malaise/fatigue and weight loss.  Respiratory:  Negative for shortness of breath and wheezing.   Cardiovascular:  Negative for chest pain and leg swelling.  Gastrointestinal:  Negative for abdominal pain, blood in stool, constipation, nausea and vomiting.  Genitourinary:  Negative for dysuria, frequency, hematuria and urgency.  Musculoskeletal:  Negative for joint pain and myalgias.  Neurological:  Negative for weakness.  Psychiatric/Behavioral:  Negative for depression. The patient does not have insomnia.    Current medications, allergy, social history, past surgical history, past medical history, family history were all reviewed.  Vitals:  BP 115/65 (BP Location: Left Arm, Patient Position: Sitting)   Pulse 66   Temp 98.6 F (37 C) (Oral)   Resp 16   Wt 164 lb (74.4 kg)   SpO2 97%   BMI 32.03 kg/m    Physical Exam:  Physical Exam Exam conducted with a Ami RN present.  Constitutional:      General: Alert, oriented, in no acute distress. Cardiovascular:     Rate and Rhythm: Normal rate and regular rhythm.  Pulmonary:     Effort: Pulmonary effort is normal.      Breath sounds: Normal breath sounds. No wheezing or rhonchi.  Abdominal:   Incisions: Incision site with superficial separation at umbilicus, no induration or purulent drainage. Wound bed red without  exudate. Packing replaced. Erythema and irritation around incision sites. Satellite maculopapular, erythematous rash remains with little change from last appt.    Palpations: Abdomen is soft.     Tenderness: There is no abdominal tenderness.   Musculoskeletal:      Right lower leg: No edema.     Left lower leg: No edema.  Neurological:     Mental Status: She is oriented to person, place, and time.   Assessment/Plan:   Superficial separation at the umbilicus is healing without signs of infection. Irrigated and packed with gauze. -Continue with at least daily dressing changes. -Reviewed warning signs of infection. -Instructed re: wound care  Allergic contact dermatitis Reaction to surgical adhesive around incisions with pruritus and erythema. -Given minimal change in rash/blisters and symptoms, plan for prednisone  dose pack -Use antihistamines as needed for itching.  Plan for follow up on 07/28/23 or sooner if symptoms persist, worsen. Reportable signs and symptoms reviewed.   Eleanor JONETTA Epps, NP

## 2023-07-18 NOTE — Patient Instructions (Signed)
 You are doing well after surgery. There is still rash/blisters present around the incisions that has not decreased significantly since we saw you last.   We are going to send in a prednisone  steroid dose pack to take to treat the skin reaction.   Please keep us  updated on how the rash is going after several days.   Continue placing the packing strip at least once daily or when it is soiled or falls out. You can take a shower and let the water  rinse over the open area then place the packing strip in after. Continue wearing the tighter tank tops and yoga pants to help support your abdomen.

## 2023-07-20 ENCOUNTER — Telehealth: Payer: Self-pay | Admitting: *Deleted

## 2023-07-20 NOTE — Telephone Encounter (Signed)
 Spoke with Elizabeth [name] who states she just started taking the steroids today. Pt states her pharmacy didn't have them ready. Pt states she rash is about the same and not worse. Pt has no other complaints at this time. Advised patient that the office would reach back out early next week to check in. Pt thanked the office for calling.

## 2023-07-24 ENCOUNTER — Other Ambulatory Visit: Payer: Self-pay | Admitting: *Deleted

## 2023-07-24 NOTE — Progress Notes (Signed)
 The proposed treatment discussed in conference is for discussion purpose only and is not a binding recommendation.  The patients have not been physically examined, or presented with their treatment options.  Therefore, final treatment plans cannot be decided.

## 2023-07-24 NOTE — Telephone Encounter (Signed)
 Spoke with Elizabeth [name] and asked about the steroids that were prescribed last week. Pt states, "They are working perfect" rash is drying up and the incisions are healing well and my incisions are only mildly itchy. Reminded patient of her follow up appt. With Dr. Orvil Bland this Friday, 07/28/23. Pt thanked the office for calling to check on her.

## 2023-07-28 ENCOUNTER — Inpatient Hospital Stay (HOSPITAL_BASED_OUTPATIENT_CLINIC_OR_DEPARTMENT_OTHER): Payer: Medicare Other | Admitting: Gynecologic Oncology

## 2023-07-28 ENCOUNTER — Encounter: Payer: Self-pay | Admitting: Gynecologic Oncology

## 2023-07-28 VITALS — BP 123/70 | HR 70 | Temp 98.3°F | Resp 19 | Wt 167.6 lb

## 2023-07-28 DIAGNOSIS — C541 Malignant neoplasm of endometrium: Secondary | ICD-10-CM

## 2023-07-28 DIAGNOSIS — Z7189 Other specified counseling: Secondary | ICD-10-CM

## 2023-07-28 DIAGNOSIS — Z90710 Acquired absence of both cervix and uterus: Secondary | ICD-10-CM

## 2023-07-28 DIAGNOSIS — Z90722 Acquired absence of ovaries, bilateral: Secondary | ICD-10-CM

## 2023-07-28 DIAGNOSIS — Z9079 Acquired absence of other genital organ(s): Secondary | ICD-10-CM

## 2023-07-28 NOTE — Patient Instructions (Signed)
It was good to see you today.  You are healing well from surgery.  I think you will only have to pack your incision for another week or 2.  Then you can just cover it with some gauze or Band-Aid.  Please remember, no heavy lifting for 6 weeks after surgery and nothing in the vagina for at least 10-12 weeks.  I will plan to see you back for your first follow-up visit in 6 months.  If you develop any new and concerning symptoms before that next visit such as vaginal bleeding, pelvic pain, change to bowel function, or unintentional weight loss, please call to see me sooner.

## 2023-07-28 NOTE — Progress Notes (Signed)
Gynecologic Oncology Return Clinic Visit  07/28/23  Reason for Visit: follow-up, treatment planning  Treatment History: Oncology History Overview Note  Endometrial biopsy performed on 04/01/2021 reveals rare strips of atrophic endometrium and mucus, extremely scant.  Hyperplasia or malignancy noted. Pelvic ultrasound exam on 11/25/2022 at [facility] shows a thickened endometrium measuring 3.3 mm.  No discrete lesions seen on sonohysterography. Endometrial biopsy performed at the time of the above ultrasound on 6/14 reveals minute fragments of benign endocervical glands, no diagnostic endometrial tissue. Repeat pelvic ultrasound exam on 01/19/2023 reveals a uterus measuring 13.3 x 10.1 x 4.3 cm endometrium measures 8.1 mm, trace free fluid noted in the endometrium.  Multiple fibroids noted, the largest measures up to 5.7 cm.  Ovaries within normal limits. Hysteroscopy with endometrial sampling, incomplete resection of endometrial polyp on 05/26/2023. 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 P53 WT.   Endometrial cancer (HCC)  07/05/2023 Initial Diagnosis   Endometrial cancer (HCC)   07/05/2023 Surgery   Robotic-assisted laparoscopic total hysterectomy with bilateral salpingoophorectomy, SLN biopsy bilaterally   Findings: On EUA, enlarged mobile uterus. 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 Normal appearing omentum, small and large bowel, appendix. Uterus 12-14 cm with large fundal fibroid. Normal appearing bilateral adnexa with some filmy adhesions from the left ovary to the sigmoid mesentery and posterior uterus. Mapping successful to left obturator, right external iliac, and right obturator (non-green lymph node but two green channels let to this node). No ascites. No obvious adenopathy or evidence of extra-uterine disease.     07/05/2023 Pathology Results   Grade 2 20% MI No LVI, Neg SLNs      Interval History: Doing well.  Denies any vaginal bleeding.  Continuing to take the Senokot, notes bowels are working well.  Denies any urinary symptoms. Itching and rash around her incisions has significantly improved.  Continues to pack her supraumbilical incision daily.  Past Medical/Surgical History: Past Medical History:  Diagnosis Date   Acne    Eczema    Endometrial cancer [facility])     Past Surgical History:  Procedure Laterality Date   DILATATION & CURETTAGE/HYSTEROSCOPY WITH MYOSURE N/A 05/26/2023   Procedure: DILATATION & CURETTAGE/HYSTEROSCOPY WITH MYOSURE;  Surgeon: Olivia Mackie, MD;  Location: [facility];  Service: Gynecology;  Laterality: N/A;   ROBOTIC ASSISTED TOTAL HYSTERECTOMY WITH BILATERAL SALPINGO OOPHERECTOMY Bilateral 07/05/2023   Procedure: XI ROBOTIC ASSISTED TOTAL HYSTERECTOMY WITH BILATERAL SALPINGO OOPHORECTOMY WITH LAPAROTOMY, UTERUS GREATER THAN 250 GRAMS;  Surgeon: Carver Fila, MD;  Location: [facility];  Service: Gynecology;  Laterality: Bilateral;   SENTINEL NODE BIOPSY N/A 07/05/2023   Procedure: SENTINEL NODE BIOPSY;  Surgeon: Carver Fila, MD;  Location: [facility];  Service: Gynecology;  Laterality: N/A;   TUBAL LIGATION      Family History  Problem Relation Age of Onset   Hepatitis Mother 32       hep C   Heart disease Father 48       MI   Breast cancer Sister    Cancer Sister 68       breast   Dementia Sister 44   Diabetes Brother    Hyperlipidemia Brother    Hypertension Brother    Aneurysm Maternal Grandfather    Breast cancer Maternal Aunt    Breast cancer Maternal Aunt    Colon cancer Neg Hx    Ovarian  cancer Neg Hx    Endometrial cancer Neg Hx    Pancreatic cancer Neg Hx    Prostate cancer Neg Hx     Social History   Socioeconomic History   Marital status: Single    Spouse name: Not on file   Number of children: 1   Years of  education: 14   Highest education level: Not on file  Occupational History   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[employer]    Employer: [employer]  Tobacco Use   Smoking status: Never   Smokeless tobacco: Never  Vaping Use   Vaping status: Never Used  Substance and Sexual Activity   Alcohol use: No   Drug use: No   Sexual activity: Never    Partners: Male    Birth control/protection: Surgical  Other Topics Concern   Not on file  Social History Narrative   Exercise--- walk   Social Drivers of Health   Financial Resource Strain: Not on file  Food Insecurity: No Food Insecurity (06/08/2023)   Hunger Vital Sign    Worried About Running Out of Food in the Last Year: Never true    Ran Out of Food in the Last Year: Never true  Transportation Needs: No Transportation Needs (06/08/2023)   PRAPARE - Administrator, Civil Service (Medical): No    Lack of Transportation (Non-Medical): No  Physical Activity: Not on file  Stress: Not on file  Social Connections: Not on file    Current Medications:  Current Outpatient Medications:    predniSONE (DELTASONE) 5 MG tablet, Day1: 10 mg (2 tablets) before breakfast, 5 mg at lunch, 5 mg at dinner, 10 mg (2 tablets) at bedtime Day2: 5 mg at breakfast, 5 mg at lunch, 5 mg at dinner, 10 mg (2 tablets) at bedtime Day3: 5 mg four times daily (with meals and at bedtime) Day4: 5 mg three times daily (with meals) Day5: 5 mg two times daily (breakfast, bedtime) Day 6: 5 mg before breakfast, Disp: 21 tablet, Rfl: 0   senna-docusate (SENOKOT-S) 8.6-50 MG tablet, Take 2 tablets by mouth at bedtime. For AFTER surgery, do not take if having diarrhea, Disp: 30 tablet, Rfl: 0   triamcinolone cream (KENALOG) 0.5 %, Apply topically 2 (two) times daily. Apply to rash around incisions and sides of abdomen. Do not place in incisions, Disp: 60 g, Rfl: 1   Ascorbic Acid (VITAMIN C) 1000 MG tablet, Take 1,000 mg by mouth daily. (Patient not  taking: Reported on 07/24/2023), Disp: , Rfl:    B Complex Vitamins (B COMPLEX 100 PO), Take 1 tablet by mouth daily. (Patient not taking: Reported on 07/24/2023), Disp: , Rfl:    calcium carbonate (OS-CAL) 600 MG tablet, Take 1,200 mg by mouth daily. (Patient not taking: Reported on 07/24/2023), Disp: , Rfl:    Cholecalciferol (VITAMIN D) 50 MCG (2000 UT) CAPS, Take 2,000 Units by mouth daily. (Patient not taking: Reported on 07/24/2023), Disp: , Rfl:    hydrOXYzine (ATARAX) 10 MG tablet, Take 1 tablet (10 mg total) by mouth 2 (two) times daily as needed for itching. Use caution, may cause drowsiness (Patient not taking: Reported on 07/24/2023), Disp: 15 tablet, Rfl: 0   Multiple Vitamin (MULTIVITAMIN WITH MINERALS) TABS tablet, Take 1 tablet by mouth daily. (Patient not taking: Reported on 07/24/2023), Disp: , Rfl:   Review of Systems: Denies appetite changes, fevers, chills, fatigue, unexplained weight changes. Denies hearing loss, neck lumps or masses, mouth sores,  ringing in ears or voice changes. Denies cough or wheezing.  Denies shortness of breath. Denies chest pain or palpitations. Denies leg swelling. Denies abdominal distention, pain, blood in stools, constipation, diarrhea, nausea, vomiting, or early satiety. Denies pain with intercourse, dysuria, frequency, hematuria or incontinence. Denies hot flashes, pelvic pain, vaginal bleeding or vaginal discharge.   Denies joint pain, back pain or muscle pain/cramps. Denies itching, rash, or wounds. Denies dizziness, headaches, numbness or seizures. Denies swollen lymph nodes or glands, denies easy bruising or bleeding. Denies anxiety, depression, confusion, or decreased concentration.  Physical Exam: BP 123/70 (BP Location: Left Arm, Patient Position: Sitting)   Pulse 70   Temp 98.3 F (36.8 C) (Oral)   Resp 19   Wt 167 lb 9.6 oz (76 kg)   SpO2 97%   BMI 32.73 kg/m  General: Alert, oriented, no acute distress. HEENT: Posterior  oropharynx clear, sclera anicteric. Chest: Unlabored breathing on room air. Abdomen: soft, nontender.  Normoactive bowel sounds.  No masses or hepatosplenomegaly appreciated.  Incisions are healing well.  Supraumbilical incision is open approximately 2-3 cm, very shallow.  No exudate, induration, or erythema.  All amount of packing within the wound with 4 x 4 on top. Extremities: Grossly normal range of motion.  Warm, well perfused.  No edema bilaterally. GU: Normal appearing external genitalia without erythema, excoriation, or lesions.  Speculum exam reveals cuff intact, suture visible.  Bimanual exam reveals cuff intact, no fluctuance or tenderness to palpation.   Laboratory & Radiologic Studies: None new  Assessment & Plan: Alecea Trego is a 68 y.o. 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 P53 WT, MMRd/MSI-H, MLH1 promoter hypermethylation present.  The patient is overall doing well from a postoperative standpoint.  Discussed continued expectations and restrictions.  Small superficial separation of her supraumbilical incision is healing well.  Discussed pathology with her in detail from surgery.  She was given a copy of her pathology report which we reviewed together.  Discussed overall low risk disease.  In the setting of grade 1 or grade 2 endometrioid cancer confined to the inner half of the uterus, observation is preferred given low risk of recurrence.  We discussed that in patients who are over 60 or who have LVI (which she does not), vaginal brachii can be considered.  My recommendation would be to proceed with close observation but I offered referral to radiation oncology if she would like to hear more about vaginal brachytherapy.  Ultimately, she was comfortable with close observation.  Discussed MMR, MSI, and MLH1 test results would indicate most likely sporadic cancer.  Per NCCN surveillance recommendations, we will plan on  follow-up visits every 6 months alternating between my office and her OB/GYN.  I will see her for her first is not in 6 months.  I reviewed signs and symptoms that would be concerning for possible recurrence and should prompt a phone call before her neck scheduled visit.  18 minutes of total time was spent for this patient encounter, including preparation, face-to-face counseling with the patient and coordination of care, and documentation of the encounter.  Eugene Garnet, MD  Division of Gynecologic Oncology  Department of Obstetrics and Gynecology  [facility]

## 2023-08-25 ENCOUNTER — Telehealth: Payer: Self-pay | Admitting: *Deleted

## 2023-08-25 NOTE — Telephone Encounter (Signed)
 Spoke with Elizabeth [name] who called the office stating she has been having vulva itching for over two weeks. Pt denies fever, chills, pain, bleeding, discharge and all urinary symptoms. She has tried over the counter monistat cream for the last week and it has not helped. Pt reports that she developed a red rash, not raised that was itchy to both breast after she used a spray cologne and that is also when the vulva itching started. Pt denies putting anything in the vagina and the itching is not in the vagina.   Elizabeth Mccreedy, NP was consulted and advised the patient to try an over the counter steroid cream and place a thin layer only on the areas of the vulva that are itching and monitor over the weekend and if symptoms do not improve to call your gyn and keep [place] posted with how you are feeling. Pt verbalized understanding and thanked the office.

## 2023-10-19 ENCOUNTER — Ambulatory Visit

## 2023-12-07 ENCOUNTER — Ambulatory Visit

## 2023-12-13 ENCOUNTER — Ambulatory Visit: Admitting: *Deleted

## 2023-12-13 VITALS — Ht 60.0 in | Wt 168.0 lb

## 2023-12-13 DIAGNOSIS — Z780 Asymptomatic menopausal state: Secondary | ICD-10-CM | POA: Diagnosis not present

## 2023-12-13 DIAGNOSIS — Z0000 Encounter for general adult medical examination without abnormal findings: Secondary | ICD-10-CM | POA: Diagnosis not present

## 2023-12-13 NOTE — Patient Instructions (Addendum)
 Ms. Elizabeth [name] , Thank you for taking time out of your busy schedule to complete your Annual Wellness Visit with me. I enjoyed our conversation and look forward to speaking with you again next year. I, as well as your care team,  appreciate your ongoing commitment to your health goals. Please review the following plan we discussed and let me know if I can assist you in the future. Your Game plan/ To Do List    Referrals: If you haven't heard from the office you've been referred to, please reach out to them at the phone provided.  Bone Density -- [facility] [phone] Follow up Visits: Next Medicare AWV with our clinical staff:   12/18/24 8:20am  Next Office Visit with your provider: 02/27/24 8:40am  Clinician Recommendations:  Aim for 30 minutes of exercise or brisk walking, 6-8 glasses of water , and 5 servings of fruits and vegetables each day.       This is a list of the screening recommended for you and due dates:  Health Maintenance  Topic Date Due   COVID-19 Vaccine (4 - 2024-25 season) 02/12/2023   Colon Cancer Screening  05/21/2023   Medicare Annual Wellness Visit  10/14/2023   Flu Shot  09/11/2027*   DEXA scan (bone density measurement)  01/01/2024   Mammogram  01/03/2024   DTaP/Tdap/Td vaccine (3 - Td or Tdap) 09/16/2025   Pneumococcal Vaccine for age over 8  Completed   Hepatitis C Screening  Completed   Zoster (Shingles) Vaccine  Completed   Hepatitis B Vaccine  Aged Out   HPV Vaccine  Aged Out   Meningitis B Vaccine  Aged Out  *Topic was postponed. The date shown is not the original due date.    Advanced directives: (Copy Requested) Please bring a copy of your health care power of attorney and living will to the office to be added to your chart at your convenience. You can mail to [facility] [address] [place], [place] [address] or email to [email or web address] Advance Care Planning is important because it:  [x]  Makes sure you receive  the medical care that is consistent with your values, goals, and preferences  [x]  It provides guidance to your family and loved ones and reduces their decisional burden about whether or not they are making the right decisions based on your wishes.  Follow the link provided in your after visit summary or read over the paperwork we have mailed to you to help you started getting your Advance Directives in place. If you need assistance in completing these, please reach out to us  so that we can help you!  See attachments for Preventive Care and Fall Prevention Tips.

## 2023-12-13 NOTE — Progress Notes (Signed)
 Please attest this visit in the absence of patient primary care provider.    Subjective:   Elizabeth [name] is a 68 y.o. who presents for a Medicare Wellness preventive visit.  As a reminder, Annual Wellness Visits don't include a physical exam, and some assessments may be limited, especially if this visit is performed virtually. We may recommend an in-person follow-up visit with your provider if needed.  Visit Complete: Virtual I connected with  Brianna Badalamenti on 12/13/23 by a audio enabled telemedicine application and verified that I am speaking with the correct person using two identifiers.  Patient Location: Home  Provider Location: Office/Clinic  I discussed the limitations of evaluation and management by telemedicine. The patient expressed understanding and agreed to proceed.  Vital Signs: Because this visit was a virtual/telehealth visit, some criteria may be missing or patient reported. Any vitals not documented were not able to be obtained and vitals that have been documented are patient reported.  VideoDeclined- This patient declined Librarian, academic. Therefore the visit was completed with audio only.  Persons Participating in Visit: Patient.  AWV Questionnaire: No: Patient Medicare AWV questionnaire was not completed prior to this visit.  Cardiac Risk Factors include: advanced age (>58men, >27 women);Other (see comment), Risk factor comments: hx of enometrial cancer     Objective:    Today's Vitals   12/13/23 0816  Weight: 168 lb (76.2 kg)  Height: 5' (1.524 m)   Body mass index is 32.81 kg/m.     12/13/2023    8:35 AM 07/05/2023    6:41 AM 06/29/2023    8:57 AM 05/26/2023   11:02 AM 12/05/2016   10:44 AM  Advanced Directives  Does Patient Have a Medical Advance Directive? No No No Yes No   Does patient want to make changes to medical advance directive?    Yes (MAU/Ambulatory/Procedural Areas - Information given)   Would patient  like information on creating a medical advance directive? No - Patient declined No - Patient declined No - Patient declined  No - Patient declined      Data saved with a previous flowsheet row definition    Current Medications (verified) Outpatient Encounter Medications as of 12/13/2023  Medication Sig   Ascorbic Acid (VITAMIN C) 1000 MG tablet Take 1,000 mg by mouth daily.   B Complex Vitamins (B COMPLEX 100 PO) Take 1 tablet by mouth daily.   calcium carbonate (OS-CAL) 600 MG tablet Take 1,200 mg by mouth daily.   Cholecalciferol (VITAMIN D ) 50 MCG (2000 UT) CAPS Take 2,000 Units by mouth daily.   Multiple Vitamin (MULTIVITAMIN WITH MINERALS) TABS tablet Take 1 tablet by mouth daily.   triamcinolone  cream (KENALOG ) 0.5 % Apply topically 2 (two) times daily. Apply to rash around incisions and sides of abdomen. Do not place in incisions (Patient taking differently: Apply 1 Application topically 2 (two) times daily as needed. Apply to rash around incisions and sides of abdomen. Do not place in incisions)   [DISCONTINUED] senna-docusate (SENOKOT-S) 8.6-50 MG tablet Take 2 tablets by mouth at bedtime. For AFTER surgery, do not take if having diarrhea   [DISCONTINUED] hydrOXYzine  (ATARAX ) 10 MG tablet Take 1 tablet (10 mg total) by mouth 2 (two) times daily as needed for itching. Use caution, may cause drowsiness (Patient not taking: Reported on 07/24/2023)   [DISCONTINUED] predniSONE  (DELTASONE ) 5 MG tablet Day1: 10 mg (2 tablets) before breakfast, 5 mg at lunch, 5 mg at dinner, 10 mg (2 tablets) at bedtime  Day2: 5 mg at breakfast, 5 mg at lunch, 5 mg at dinner, 10 mg (2 tablets) at bedtime Day3: 5 mg four times daily (with meals and at bedtime) Day4: 5 mg three times daily (with meals) Day5: 5 mg two times daily (breakfast, bedtime) Day 6: 5 mg before breakfast   No facility-administered encounter medications on file as of 12/13/2023.    Allergies (verified) 2-(ethylmercuriothio)benzoic acid,  Cyanoacrylate, Hydroxyethyl methacrylate, Other, and Wound dressing adhesive   History: Past Medical History:  Diagnosis Date   Acne    Eczema    Endometrial cancer (HCC)    Past Surgical History:  Procedure Laterality Date   DILATATION & CURETTAGE/HYSTEROSCOPY WITH MYOSURE N/A 05/26/2023   Procedure: DILATATION & CURETTAGE/HYSTEROSCOPY WITH MYOSURE;  Surgeon: Gorge Ade, MD;  Location: [facility];  Service: Gynecology;  Laterality: N/A;   ROBOTIC ASSISTED TOTAL HYSTERECTOMY WITH BILATERAL SALPINGO OOPHERECTOMY Bilateral 07/05/2023   Procedure: XI ROBOTIC ASSISTED TOTAL HYSTERECTOMY WITH BILATERAL SALPINGO OOPHORECTOMY WITH LAPAROTOMY, UTERUS GREATER THAN 250 GRAMS;  Surgeon: Viktoria Comer SAUNDERS, MD;  Location: [facility];  Service: Gynecology;  Laterality: Bilateral;   SENTINEL NODE BIOPSY N/A 07/05/2023   Procedure: SENTINEL NODE BIOPSY;  Surgeon: Viktoria Comer SAUNDERS, MD;  Location: [facility];  Service: Gynecology;  Laterality: N/A;   TUBAL LIGATION     Family History  Problem Relation Age of Onset   Hepatitis Mother 3       hep C   Heart disease Father 77       MI   Breast cancer Sister    Cancer Sister 63       breast   Dementia Sister 67   Diabetes Brother    Hyperlipidemia Brother    Hypertension Brother    Aneurysm Maternal Grandfather    Breast cancer Maternal Aunt    Breast cancer Maternal Aunt    Colon cancer Neg Hx    Ovarian cancer Neg Hx    Endometrial cancer Neg Hx    Pancreatic cancer Neg Hx    Prostate cancer Neg Hx    Social History   Socioeconomic History   Marital status: Single    Spouse name: Not on file   Number of children: 1   Years of education: 14   Highest education level: Not on file  Occupational History   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[employer]    Employer: [employer]  Tobacco Use   Smoking status: Never   Smokeless tobacco: Never  Vaping Use   Vaping status: Never Used  Substance and Sexual  Activity   Alcohol use: No   Drug use: No   Sexual activity: Never    Partners: Male    Birth control/protection: Surgical  Other Topics Concern   Not on file  Social History Narrative   Exercise--- walk   Social Drivers of Health   Financial Resource Strain: Low Risk  (12/13/2023)   Overall Financial Resource Strain (CARDIA)    Difficulty of Paying Living Expenses: Not hard at all  Food Insecurity: No Food Insecurity (12/13/2023)   Hunger Vital Sign    Worried About Running Out of Food in the Last Year: Never true    Ran Out of Food in the Last Year: Never true  Transportation Needs: No Transportation Needs (12/13/2023)   PRAPARE - Administrator, Civil Service (Medical): No    Lack of Transportation (Non-Medical): No  Physical Activity: Sufficiently Active (12/13/2023)   Exercise  Vital Sign    Days of Exercise per Week: 4 days    Minutes of Exercise per Session: 50 min  Stress: No Stress Concern Present (12/13/2023)   [employer] of Occupational Health - Occupational Stress Questionnaire    Feeling of Stress: Not at all  Social Connections: Moderately Isolated (12/13/2023)   Social Connection and Isolation Panel    Frequency of Communication with Friends and Family: More than three times a week    Frequency of Social Gatherings with Friends and Family: More than three times a week    Attends Religious Services: More than 4 times per year    Active Member of [employer] or Organizations: No    Attends Engineer, structural: Never    Marital Status: Divorced    Tobacco Counseling Counseling given: Not Answered    Clinical Intake:  Pre-visit preparation completed: Yes  Pain : No/denies pain (has occasional aching in both knees)     BMI - recorded: 32.81 Nutritional Risks: None Diabetes: No  No results found for: HGBA1C   How often do you need to have someone help you when you read instructions, pamphlets, or other written materials from your doctor  or pharmacy?: 1 - Never What is the last grade level you completed in school?: some college classes  Interpreter Needed?: No  Information entered by :: Lolita Libra, CMA   Activities of Daily Living     12/13/2023    8:25 AM 07/05/2023    5:59 AM  In your present state of health, do you have any difficulty performing the following activities:  Hearing? 0 0  Vision? 0 0  Difficulty concentrating or making decisions? 0 0  Walking or climbing stairs? 1   Comment having bilateral knee pain but can still take stairs   Dressing or bathing? 0   Doing errands, shopping? 0   Preparing Food and eating ? N   Using the Toilet? N   In the past six months, have you accidently leaked urine? N   Do you have problems with loss of bowel control? N   Managing your Medications? N   Managing your Finances? N   Housekeeping or managing your Housekeeping? N     Patient Care Team: Antonio Meth, Jamee SAUNDERS, DO as PCP - General Gorge Ade, MD as Consulting Physician (Obstetrics and Gynecology) [facility] (Optometry)  I have updated your Care Teams any recent Medical Services you may have received from other providers in the past year.     Assessment:   This is a routine wellness examination for Larrisa.  Hearing/Vision screen Hearing Screening - Comments:: Denies hearing difficulties.  Vision Screening - Comments:: Wears RX glasses -- up to date with routine eye exams with [facility]    Goals Addressed               This Visit's Progress     Patient Stated (pt-stated)        She wants to maintain her current health.       Depression Screen     12/13/2023    8:31 AM 10/14/2022    2:09 PM 08/09/2021    2:13 PM 08/07/2020    9:10 AM 04/27/2018    4:41 PM 12/05/2016   10:44 AM 04/10/2012    1:12 PM  PHQ 2/9 Scores  PHQ - 2 Score 0 0 0 0 0 0 0   PHQ- 9 Score     0  Data saved with a previous flowsheet row definition    Fall Risk      12/13/2023    8:27 AM 10/14/2022    2:09 PM 08/09/2021    2:13 PM  Fall Risk   Falls in the past year? 0 0 0  Number falls in past yr: 0 0 0  Injury with Fall? 0 0 0  Risk for fall due to : No Fall Risks    Follow up Falls evaluation completed Falls evaluation completed Falls evaluation completed      Data saved with a previous flowsheet row definition    MEDICARE RISK AT HOME:  Medicare Risk at Home Any stairs in or around the home?: No Home free of loose throw rugs in walkways, pet beds, electrical cords, etc?: Yes Adequate lighting in your home to reduce risk of falls?: Yes Life alert?: No Use of a cane, walker or w/c?: No Grab bars in the bathroom?: No Shower chair or bench in shower?: No Elevated toilet seat or a handicapped toilet?: No  TIMED UP AND GO:  Was the test performed?  No  Cognitive Function: 6CIT completed    10/14/2022    3:53 PM  MMSE - Mini Mental State Exam  Orientation to time 5  Orientation to Place 5  Registration 3  Attention/ Calculation 5  Recall 3  Language- name 2 objects 2  Language- repeat 1  Language- follow 3 step command 3  Language- read & follow direction 1  Write a sentence 1  Copy design 1  Total score 30        12/13/2023    8:36 AM  6CIT Screen  What Year? 0 points  What month? 0 points  What time? 0 points  Count back from 20 0 points  Months in reverse 0 points  Repeat phrase 0 points  Total Score 0 points    Immunizations Immunization History  Administered Date(s) Administered   PFIZER(Purple Top)SARS-COV-2 Vaccination 09/08/2019, 09/29/2019, 05/15/2020   PNEUMOCOCCAL CONJUGATE-20 08/09/2021   Td 05/25/2005   Tdap 09/17/2015   Zoster Recombinant(Shingrix ) 04/27/2018, 07/06/2018    Screening Tests Health Maintenance  Topic Date Due   Colonoscopy  05/21/2023   COVID-19 Vaccine (4 - 2024-25 season) 12/12/2024 (Originally 02/12/2023)   INFLUENZA VACCINE  09/11/2027 (Originally 01/12/2024)   DEXA SCAN  01/01/2024    MAMMOGRAM  01/03/2024   Medicare Annual Wellness (AWV)  12/12/2024   DTaP/Tdap/Td (3 - Td or Tdap) 09/16/2025   Pneumococcal Vaccine: 50+ Years  Completed   Hepatitis C Screening  Completed   Zoster Vaccines- Shingrix   Completed   Hepatitis B Vaccines  Aged Out   HPV VACCINES  Aged Out   Meningococcal B Vaccine  Aged Out    Health Maintenance  Health Maintenance Due  Topic Date Due   Colonoscopy  05/21/2023   Health Maintenance Items Addressed: Mammogram scheduled, DEXA ordered, colonoscopy scheduled for 12/21/23 w/[facility]  Additional Screening:  Vision Screening: Recommended annual ophthalmology exams for early detection of glaucoma and other disorders of the eye. Would you like a referral to an eye doctor? No    Dental Screening: Recommended annual dental exams for proper oral hygiene  Community Resource Referral / Chronic Care Management: CRR required this visit?  No   CCM required this visit?  No   Plan:    I have personally reviewed and noted the following in the patient's chart:   Medical and social history Use of alcohol, tobacco or illicit drugs  Current medications and supplements including opioid prescriptions. Patient is not currently taking opioid prescriptions. Functional ability and status Nutritional status Physical activity Advanced directives List of other physicians Hospitalizations, surgeries, and ER visits in previous 12 months Vitals Screenings to include cognitive, depression, and falls Referrals and appointments  In addition, I have reviewed and discussed with patient certain preventive protocols, quality metrics, and best practice recommendations. A written personalized care plan for preventive services as well as general preventive health recommendations were provided to patient.   Lolita Libra, CMA   12/13/2023   After Visit Summary: (MyChart) Due to this being a telephonic visit, the after visit summary with patients personalized  plan was offered to patient via MyChart   Notes: Nothing significant to report at this time.

## 2023-12-19 ENCOUNTER — Encounter: Admitting: Family Medicine

## 2023-12-21 DIAGNOSIS — K635 Polyp of colon: Secondary | ICD-10-CM | POA: Diagnosis not present

## 2023-12-21 DIAGNOSIS — D123 Benign neoplasm of transverse colon: Secondary | ICD-10-CM | POA: Diagnosis not present

## 2023-12-21 DIAGNOSIS — Z1211 Encounter for screening for malignant neoplasm of colon: Secondary | ICD-10-CM | POA: Diagnosis not present

## 2023-12-21 LAB — HM COLONOSCOPY

## 2023-12-28 ENCOUNTER — Encounter: Payer: Self-pay | Admitting: Family Medicine

## 2024-01-04 ENCOUNTER — Inpatient Hospital Stay: Attending: Gynecologic Oncology | Admitting: Gynecologic Oncology

## 2024-01-04 ENCOUNTER — Encounter: Payer: Self-pay | Admitting: Gynecologic Oncology

## 2024-01-04 VITALS — BP 125/74 | HR 75 | Temp 97.7°F | Resp 18 | Wt 170.4 lb

## 2024-01-04 DIAGNOSIS — Z8542 Personal history of malignant neoplasm of other parts of uterus: Secondary | ICD-10-CM | POA: Diagnosis present

## 2024-01-04 DIAGNOSIS — Z90722 Acquired absence of ovaries, bilateral: Secondary | ICD-10-CM | POA: Insufficient documentation

## 2024-01-04 DIAGNOSIS — Z9079 Acquired absence of other genital organ(s): Secondary | ICD-10-CM | POA: Insufficient documentation

## 2024-01-04 DIAGNOSIS — Z90710 Acquired absence of both cervix and uterus: Secondary | ICD-10-CM | POA: Diagnosis not present

## 2024-01-04 DIAGNOSIS — C541 Malignant neoplasm of endometrium: Secondary | ICD-10-CM

## 2024-01-04 NOTE — Progress Notes (Addendum)
 Gynecologic Oncology Return Clinic Visit  01/04/24  Reason for Visit: surveillance  Treatment History: Oncology History Overview Note  Endometrial biopsy performed on 04/01/2021 reveals rare strips of atrophic endometrium and mucus, extremely scant.  Hyperplasia or malignancy noted. Pelvic ultrasound exam on 11/25/2022 at [facility] shows a thickened endometrium measuring 3.3 mm.  No discrete lesions seen on sonohysterography. Endometrial biopsy performed at the time of the above ultrasound on 6/14 reveals minute fragments of benign endocervical glands, no diagnostic endometrial tissue. Repeat pelvic ultrasound exam on 01/19/2023 reveals a uterus measuring 13.3 x 10.1 x 4.3 cm endometrium measures 8.1 mm, trace free fluid noted in the endometrium.  Multiple fibroids noted, the largest measures up to 5.7 cm.  Ovaries within normal limits. Hysteroscopy with endometrial sampling, incomplete resection of endometrial polyp on 05/26/2023. 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 P53 WT.   Endometrial cancer (HCC)  07/05/2023 Initial Diagnosis   Endometrial cancer (HCC)   07/05/2023 Surgery   Robotic-assisted laparoscopic total hysterectomy with bilateral salpingoophorectomy, SLN biopsy bilaterally   Findings: On EUA, enlarged mobile uterus. 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 Normal appearing omentum, small and large bowel, appendix. Uterus 12-14 cm with large fundal fibroid. Normal appearing bilateral adnexa with some filmy adhesions from the left ovary to the sigmoid mesentery and posterior uterus. Mapping successful to left obturator, right external iliac, and right obturator (non-green lymph node but two green channels let to this node). No ascites. No obvious adenopathy or evidence of extra-uterine disease.    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  Denies any vaginal bleeding.  Reports baseline bowel and bladder function.  Denies any abdominal pain.  Past Medical/Surgical History: Past Medical History:  Diagnosis Date   Acne    Eczema    Endometrial cancer [facility])     Past Surgical History:  Procedure Laterality Date   DILATATION & CURETTAGE/HYSTEROSCOPY WITH MYOSURE N/A 05/26/2023   Procedure: DILATATION & CURETTAGE/HYSTEROSCOPY WITH MYOSURE;  Surgeon: Gorge Ade, MD;  Location: [facility] [place];  Service: Gynecology;  Laterality: N/A;   ROBOTIC ASSISTED TOTAL HYSTERECTOMY WITH BILATERAL SALPINGO OOPHERECTOMY Bilateral 07/05/2023   Procedure: XI ROBOTIC ASSISTED TOTAL HYSTERECTOMY WITH BILATERAL SALPINGO OOPHORECTOMY WITH LAPAROTOMY, UTERUS GREATER THAN 250 GRAMS;  Surgeon: Viktoria Comer SAUNDERS, MD;  Location: [facility];  Service: Gynecology;  Laterality: Bilateral;   SENTINEL NODE BIOPSY N/A 07/05/2023   Procedure: SENTINEL NODE BIOPSY;  Surgeon: Viktoria Comer SAUNDERS, MD;  Location: [facility];  Service: Gynecology;  Laterality: N/A;   TUBAL LIGATION      Family History  Problem Relation Age of Onset   Hepatitis Mother 58       hep C   Heart disease Father 9       MI   Breast cancer Sister    Cancer Sister 15       breast   Dementia Sister 58   Diabetes Brother    Hyperlipidemia Brother    Hypertension Brother    Aneurysm Maternal Grandfather    Breast cancer Maternal Aunt    Breast cancer Maternal Aunt    Colon cancer Neg Hx    Ovarian cancer Neg Hx    Endometrial cancer Neg Hx    Pancreatic cancer Neg Hx    Prostate cancer Neg  Hx     Social History   Socioeconomic History   Marital status: Single    Spouse name: Not on file   Number of children: 1   Years of education: 14   Highest education level: Not on file  Occupational History   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[employer]    Employer: [employer]  Tobacco Use   Smoking status: Never   Smokeless tobacco: Never  Vaping Use   Vaping status: Never Used  Substance and Sexual Activity   Alcohol use: No   Drug use: No   Sexual activity: Never    Partners: Male    Birth control/protection: Surgical  Other Topics Concern   Not on file  Social History Narrative   Exercise--- walk   Social Drivers of Health   Financial Resource Strain: Low Risk  (12/13/2023)   Overall Financial Resource Strain (CARDIA)    Difficulty of Paying Living Expenses: Not hard at all  Food Insecurity: No Food Insecurity (12/13/2023)   Hunger Vital Sign    Worried About Running Out of Food in the Last Year: Never true    Ran Out of Food in the Last Year: Never true  Transportation Needs: No Transportation Needs (12/13/2023)   PRAPARE - Administrator, Civil Service (Medical): No    Lack of Transportation (Non-Medical): No  Physical Activity: Sufficiently Active (12/13/2023)   Exercise Vital Sign    Days of Exercise per Week: 4 days    Minutes of Exercise per Session: 50 min  Stress: No Stress Concern Present (12/13/2023)   [employer] of Occupational Health - Occupational Stress Questionnaire    Feeling of Stress: Not at all  Social Connections: Moderately Isolated (12/13/2023)   Social Connection and Isolation Panel    Frequency of Communication with Friends and Family: More than three times a week    Frequency of Social Gatherings with Friends and Family: More than three times a week    Attends Religious Services: More than 4 times per year    Active Member of [employer] or Organizations: No    Attends Banker Meetings: Never    Marital Status: Divorced    Current Medications:  Current Outpatient Medications:    Ascorbic Acid (VITAMIN C) 1000 MG tablet, Take 1,000 mg by mouth daily., Disp: , Rfl:    B Complex Vitamins (B COMPLEX 100 PO), Take 1 tablet by mouth daily., Disp: , Rfl:    calcium carbonate (OS-CAL) 600 MG  tablet, Take 1,200 mg by mouth daily., Disp: , Rfl:    Cholecalciferol (VITAMIN D ) 50 MCG (2000 UT) CAPS, Take 2,000 Units by mouth daily., Disp: , Rfl:    Multiple Vitamin (MULTIVITAMIN WITH MINERALS) TABS tablet, Take 1 tablet by mouth daily., Disp: , Rfl:    triamcinolone  cream (KENALOG ) 0.5 %, Apply topically 2 (two) times daily. Apply to rash around incisions and sides of abdomen. Do not place in incisions (Patient taking differently: Apply 1 Application topically 2 (two) times daily as needed. Apply to rash around incisions and sides of abdomen. Do not place in incisions), Disp: 60 g, Rfl: 1  Review of Systems: + joint pain, hot flashes Denies appetite changes, fevers, chills, fatigue, unexplained weight changes. Denies hearing loss, neck lumps or masses, mouth sores, ringing in ears or voice changes. Denies cough or wheezing.  Denies shortness of breath. Denies chest pain or palpitations. Denies leg swelling. Denies abdominal distention, pain, blood in  stools, constipation, diarrhea, nausea, vomiting, or early satiety. Denies pain with intercourse, dysuria, frequency, hematuria or incontinence. Denies pelvic pain, vaginal bleeding or vaginal discharge.   Denies back pain or muscle pain/cramps. Denies itching, rash, or wounds. Denies dizziness, headaches, numbness or seizures. Denies swollen lymph nodes or glands, denies easy bruising or bleeding. Denies anxiety, depression, confusion, or decreased concentration.  Physical Exam: BP 125/74 (BP Location: Left Arm, Patient Position: Sitting)   Pulse 75   Temp 97.7 F (36.5 C) (Oral)   Resp 18   Wt 170 lb 6.4 oz (77.3 kg)   SpO2 100%   BMI 33.28 kg/m  General: Alert, oriented, no acute distress. HEENT: Posterior oropharynx clear, sclera anicteric. Chest: Clear to auscultation bilaterally.  No wheezes or rhonchi. Cardiovascular: Regular rate and rhythm, no murmurs. Abdomen: soft, nontender.  Normoactive bowel sounds.  No masses or  hepatosplenomegaly appreciated.  Well-healed incisions. Extremities: Grossly normal range of motion.  Warm, well perfused.  No edema bilaterally. Skin: No rashes or lesions noted. Lymphatics: No cervical, supraclavicular, or inguinal adenopathy. GU: Normal appearing external genitalia without erythema, excoriation, or lesions.  Speculum exam reveals mildly atrophic vaginal mucosa, cuff intact without lesions.  Bimanual exam reveals no masses or nodularity.    Laboratory & Radiologic Studies: None new  Assessment & Plan: Elizabeth [name] is a 68 y.o. 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 Surgery 06/2023. No LVI, MI 20%. P53 WT, MMRd/MSI-H, MLH1 promoter hypermethylation present.  Doing very well, NED on exam today.  Per NCCN surveillance recommendations, we will continue with visits every 6 months.  Her prior OB/GYN retired.  We discussed having her establish with a new OB/GYN provider.  If she does that in the next 6 months, she will see her new provider for follow-up in 6 months and return to see us  in 12 months.  We will tentatively schedule her for a visit with us  in 6 months in the event that she does not yet have a new OB/GYN.  Reviewed signs and symptoms that should prompt a phone call between visits.  20 minutes of total time was spent for this patient encounter, including preparation, face-to-face counseling with the patient and coordination of care, and documentation of the encounter.  Comer Dollar, MD  Division of Gynecologic Oncology  Department of Obstetrics and Gynecology  [facility]

## 2024-01-04 NOTE — Patient Instructions (Signed)
 It was good to see you today.  I do not see or feel any evidence of cancer recurrence on your exam.  We will plan on follow-up visits every 6 months.  Please make an appointment with your new OB/GYN for follow-up in 6 months and we will see you back in 1 year.  If you have trouble establishing with a new OB/GYN, please let my office know at the end of this year and we will get you scheduled to see Melissa in January.  As always, if you develop any new and concerning symptoms before your next visit, please call to see me sooner.

## 2024-01-05 DIAGNOSIS — Z1231 Encounter for screening mammogram for malignant neoplasm of breast: Secondary | ICD-10-CM | POA: Diagnosis not present

## 2024-01-05 LAB — HM MAMMOGRAPHY

## 2024-01-26 ENCOUNTER — Ambulatory Visit: Payer: Medicare Other | Admitting: Gynecologic Oncology

## 2024-02-26 ENCOUNTER — Encounter: Admitting: Family Medicine

## 2024-02-27 ENCOUNTER — Ambulatory Visit (INDEPENDENT_AMBULATORY_CARE_PROVIDER_SITE_OTHER): Admitting: Family Medicine

## 2024-02-27 ENCOUNTER — Encounter: Payer: Self-pay | Admitting: Family Medicine

## 2024-02-27 VITALS — BP 118/70 | HR 70 | Temp 98.2°F | Resp 16 | Ht 60.0 in | Wt 170.2 lb

## 2024-02-27 DIAGNOSIS — Z780 Asymptomatic menopausal state: Secondary | ICD-10-CM

## 2024-02-27 DIAGNOSIS — E669 Obesity, unspecified: Secondary | ICD-10-CM | POA: Diagnosis not present

## 2024-02-27 DIAGNOSIS — Z136 Encounter for screening for cardiovascular disorders: Secondary | ICD-10-CM

## 2024-02-27 DIAGNOSIS — M25562 Pain in left knee: Secondary | ICD-10-CM | POA: Diagnosis not present

## 2024-02-27 DIAGNOSIS — M25561 Pain in right knee: Secondary | ICD-10-CM

## 2024-02-27 DIAGNOSIS — R5383 Other fatigue: Secondary | ICD-10-CM

## 2024-02-27 DIAGNOSIS — Z0000 Encounter for general adult medical examination without abnormal findings: Secondary | ICD-10-CM | POA: Diagnosis not present

## 2024-02-27 LAB — LIPID PANEL
Cholesterol: 218 mg/dL — ABNORMAL HIGH (ref 0–200)
HDL: 58.5 mg/dL (ref >39.00)
LDL Cholesterol: 139 mg/dL — ABNORMAL HIGH (ref 0–99)
NonHDL: 159.8
Total CHOL/HDL Ratio: 4
Triglycerides: 103 mg/dL (ref 0.0–149.0)
VLDL: 20.6 mg/dL (ref 0.0–40.0)

## 2024-02-27 LAB — CBC WITH DIFFERENTIAL/PLATELET
Basophils Absolute: 0 K/uL (ref 0.0–0.1)
Basophils Relative: 0.7 % (ref 0.0–3.0)
Eosinophils Absolute: 0.2 K/uL (ref 0.0–0.7)
Eosinophils Relative: 3 % (ref 0.0–5.0)
HCT: 39.9 % (ref 36.0–46.0)
Hemoglobin: 13.5 g/dL (ref 12.0–15.0)
Lymphocytes Relative: 37.9 % (ref 12.0–46.0)
Lymphs Abs: 2.2 K/uL (ref 0.7–4.0)
MCHC: 33.8 g/dL (ref 30.0–36.0)
MCV: 91.7 fl (ref 78.0–100.0)
Monocytes Absolute: 0.7 K/uL (ref 0.1–1.0)
Monocytes Relative: 11.3 % (ref 3.0–12.0)
Neutro Abs: 2.7 K/uL (ref 1.4–7.7)
Neutrophils Relative %: 47.1 % (ref 43.0–77.0)
Platelets: 237 K/uL (ref 150.0–400.0)
RBC: 4.35 Mil/uL (ref 3.87–5.11)
RDW: 12.9 % (ref 11.5–15.5)
WBC: 5.8 K/uL (ref 4.0–10.5)

## 2024-02-27 LAB — COMPREHENSIVE METABOLIC PANEL WITH GFR
ALT: 25 U/L (ref 0–35)
AST: 25 U/L (ref 0–37)
Albumin: 4.6 g/dL (ref 3.5–5.2)
Alkaline Phosphatase: 73 U/L (ref 39–117)
BUN: 14 mg/dL (ref 6–23)
CO2: 30 meq/L (ref 19–32)
Calcium: 10.7 mg/dL — ABNORMAL HIGH (ref 8.4–10.5)
Chloride: 105 meq/L (ref 96–112)
Creatinine, Ser: 0.92 mg/dL (ref 0.40–1.20)
GFR: 64.05 mL/min (ref >60.00)
Glucose, Bld: 100 mg/dL — ABNORMAL HIGH (ref 70–99)
Potassium: 4.2 meq/L (ref 3.5–5.1)
Sodium: 135 meq/L (ref 135–145)
Total Bilirubin: 1.1 mg/dL (ref 0.2–1.2)
Total Protein: 7.6 g/dL (ref 6.0–8.3)

## 2024-02-27 LAB — TSH: TSH: 1.43 u[IU]/mL (ref 0.35–5.50)

## 2024-02-27 LAB — VITAMIN D 25 HYDROXY (VIT D DEFICIENCY, FRACTURES): VITD: 47.19 ng/mL (ref 30.00–100.00)

## 2024-02-27 LAB — VITAMIN B12: Vitamin B-12: 640 pg/mL (ref 211–911)

## 2024-02-27 NOTE — Assessment & Plan Note (Signed)
 Cont diet and exercise

## 2024-02-27 NOTE — Assessment & Plan Note (Signed)
 No pain presntly  Take tylenol  arthritis or IB  Ice daily as needed  Call or return to office as needed

## 2024-02-27 NOTE — Progress Notes (Signed)
 Subjective:    Patient ID: Elizabeth [name], female    DOB: [birth year], 68 y.o.   MRN: [id number]  Chief Complaint  Patient presents with   Annual Exam    Pt states fasting     HPI Patient is in today for cpe--- Discussed the use of AI scribe software for clinical note transcription with the patient, who gave verbal consent to proceed.  History of Present Illness Merna Baldi is a 68 year old female who presents with bilateral knee pain.  She experiences intermittent bilateral knee pain, particularly after prolonged sitting, leading to stiffness upon standing. The pain is exacerbated by wearing flat shoes, while supportive shoes provide relief.  She recalls a past incident where she tripped and fell due to a hole in the cement but does not report any specific injury to her knees from that fall. Her knees sometimes make a popping sound and can feel 'crunchy'.  She reports feeling tired previously, which improved with iron supplementation. She takes a variety of vitamins including calcium, vitamin D , B complex, and vitamin C. She has not had a bone density test recently. She has not received a flu shot or COVID-19 vaccine, but has had the shingles and pneumonia vaccines.  She stays active by walking and going to the [facility], although she has not walked in over a month due to her travels. She is retired and travels frequently to [place]  to visit her daughter and grandson, who keeps her busy when he is not in school.    Past Medical History:  Diagnosis Date   Acne    Eczema    Endometrial cancer [facility])     Past Surgical History:  Procedure Laterality Date   DILATATION & CURETTAGE/HYSTEROSCOPY WITH MYOSURE N/A 05/26/2023   Procedure: DILATATION & CURETTAGE/HYSTEROSCOPY WITH MYOSURE;  Surgeon: Gorge Ade, MD;  Location: [facility];  Service: Gynecology;  Laterality: N/A;   ROBOTIC ASSISTED TOTAL HYSTERECTOMY WITH BILATERAL SALPINGO OOPHERECTOMY Bilateral 07/05/2023    Procedure: XI ROBOTIC ASSISTED TOTAL HYSTERECTOMY WITH BILATERAL SALPINGO OOPHORECTOMY WITH LAPAROTOMY, UTERUS GREATER THAN 250 GRAMS;  Surgeon: Viktoria Comer SAUNDERS, MD;  Location: [facility];  Service: Gynecology;  Laterality: Bilateral;   SENTINEL NODE BIOPSY N/A 07/05/2023   Procedure: SENTINEL NODE BIOPSY;  Surgeon: Viktoria Comer SAUNDERS, MD;  Location: [facility];  Service: Gynecology;  Laterality: N/A;   TUBAL LIGATION      Family History  Problem Relation Age of Onset   Hepatitis Mother 32       hep C   Heart disease Father 46       MI   Breast cancer Sister    Cancer Sister 49       breast   Dementia Sister 48   Diabetes Brother    Hyperlipidemia Brother    Hypertension Brother    Aneurysm Maternal Grandfather    Breast cancer Maternal Aunt    Breast cancer Maternal Aunt    Colon cancer Neg Hx    Ovarian cancer Neg Hx    Endometrial cancer Neg Hx    Pancreatic cancer Neg Hx    Prostate cancer Neg Hx     Social History   Socioeconomic History   Marital status: Single    Spouse name: Not on file   Number of children: 1   Years of education: 14   Highest education level: Not on file  Occupational History   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[employer]    Comment:  retired -- 2024    Employer: [employer]  Tobacco Use   Smoking status: Never   Smokeless tobacco: Never  Vaping Use   Vaping status: Never Used  Substance and Sexual Activity   Alcohol use: No   Drug use: No   Sexual activity: Not Currently    Partners: Male    Birth control/protection: Surgical  Other Topics Concern   Not on file  Social History Narrative   Exercise--- walk   Social Drivers of Health   Financial Resource Strain: Low Risk  (12/13/2023)   Overall Financial Resource Strain (CARDIA)    Difficulty of Paying Living Expenses: Not hard at all  Food Insecurity: No Food Insecurity (12/13/2023)   Hunger Vital Sign    Worried About Running Out of Food in the Last Year: Never true     Ran Out of Food in the Last Year: Never true  Transportation Needs: No Transportation Needs (12/13/2023)   PRAPARE - Administrator, Civil Service (Medical): No    Lack of Transportation (Non-Medical): No  Physical Activity: Sufficiently Active (12/13/2023)   Exercise Vital Sign    Days of Exercise per Week: 4 days    Minutes of Exercise per Session: 50 min  Stress: No Stress Concern Present (12/13/2023)   [employer] of Occupational Health - Occupational Stress Questionnaire    Feeling of Stress: Not at all  Social Connections: Moderately Isolated (12/13/2023)   Social Connection and Isolation Panel    Frequency of Communication with Friends and Family: More than three times a week    Frequency of Social Gatherings with Friends and Family: More than three times a week    Attends Religious Services: More than 4 times per year    Active Member of [employer] or Organizations: No    Attends Banker Meetings: Never    Marital Status: Divorced  Catering manager Violence: Not At Risk (12/13/2023)   Humiliation, Afraid, Rape, and Kick questionnaire    Fear of Current or Ex-Partner: No    Emotionally Abused: No    Physically Abused: No    Sexually Abused: No    Outpatient Medications Prior to Visit  Medication Sig Dispense Refill   Ascorbic Acid (VITAMIN C) 1000 MG tablet Take 1,000 mg by mouth daily.     B Complex Vitamins (B COMPLEX 100 PO) Take 1 tablet by mouth daily.     calcium carbonate (OS-CAL) 600 MG tablet Take 1,200 mg by mouth daily.     Cholecalciferol (VITAMIN D ) 50 MCG (2000 UT) CAPS Take 2,000 Units by mouth daily.     Multiple Vitamin (MULTIVITAMIN WITH MINERALS) TABS tablet Take 1 tablet by mouth daily.     triamcinolone  cream (KENALOG ) 0.5 % Apply topically 2 (two) times daily. Apply to rash around incisions and sides of abdomen. Do not place in incisions (Patient taking differently: Apply 1 Application topically 2 (two) times daily as needed. Apply to  rash around incisions and sides of abdomen. Do not place in incisions) 60 g 1   No facility-administered medications prior to visit.    Allergies  Allergen Reactions   2-(Ethylmercuriothio)Benzoic Acid     Other reaction(s): Other (See Comments) Positive patch test   Cyanoacrylate Rash    On abdominal incisions from surgery on 07/05/23. Caused rash, blisters, erythema. Also had skin reaction in location of adhesive from surgical drapes   Hydroxyethyl Methacrylate Rash    Positive patch test   Other Rash  Ethyl cyanoacrylate- Positive patch test Methyl methacrylate- Positive patch test   Wound Dressing Adhesive Rash    Reaction to dermabond and adhesive from surgical drapes.    Review of Systems  Constitutional:  Negative for fever.  HENT:  Negative for congestion.   Eyes:  Negative for blurred vision.  Respiratory:  Negative for cough.   Cardiovascular:  Negative for chest pain and palpitations.  Gastrointestinal:  Negative for vomiting.  Musculoskeletal:  Positive for joint pain. Negative for back pain.  Skin:  Negative for rash.  Neurological:  Negative for loss of consciousness and headaches.       Objective:    Physical Exam Vitals and nursing note reviewed.  Constitutional:      General: She is not in acute distress.    Appearance: Normal appearance. She is well-developed.  HENT:     Head: Normocephalic and atraumatic.  Eyes:     General: No scleral icterus.       Right eye: No discharge.        Left eye: No discharge.  Cardiovascular:     Rate and Rhythm: Normal rate and regular rhythm.     Heart sounds: No murmur heard. Pulmonary:     Effort: Pulmonary effort is normal. No respiratory distress.     Breath sounds: Normal breath sounds.  Musculoskeletal:        General: Normal range of motion.     Cervical back: Normal range of motion and neck supple.     Right lower leg: No edema.     Left lower leg: No edema.  Skin:    General: Skin is warm and dry.   Neurological:     Mental Status: She is alert and oriented to person, place, and time.  Psychiatric:        Mood and Affect: Mood normal.        Behavior: Behavior normal.        Thought Content: Thought content normal.        Judgment: Judgment normal.     BP 118/70 (BP Location: Left Arm, Patient Position: Sitting, Cuff Size: Normal)   Pulse 70   Temp 98.2 F (36.8 C) (Oral)   Resp 16   Ht 5' (1.524 m)   Wt 170 lb 3.2 oz (77.2 kg)   SpO2 98%   BMI 33.24 kg/m  Wt Readings from Last 3 Encounters:  02/27/24 170 lb 3.2 oz (77.2 kg)  01/04/24 170 lb 6.4 oz (77.3 kg)  12/13/23 168 lb (76.2 kg)    Diabetic Foot Exam - Simple   No data filed    Lab Results  Component Value Date   WBC 6.8 06/29/2023   HGB 13.4 06/29/2023   HCT 41.9 06/29/2023   PLT 266 06/29/2023   GLUCOSE 110 (H) 06/29/2023   CHOL 206 (H) 10/14/2022   TRIG 102 10/14/2022   HDL 62 10/14/2022   LDLDIRECT 140.8 04/10/2012   LDLCALC 124 (H) 10/14/2022   ALT 18 06/29/2023   AST 19 06/29/2023   NA 138 06/29/2023   K 4.1 06/29/2023   CL 108 06/29/2023   CREATININE 0.82 06/29/2023   BUN 20 06/29/2023   CO2 22 06/29/2023   TSH 0.81 08/09/2021    Lab Results  Component Value Date   TSH 0.81 08/09/2021   Lab Results  Component Value Date   WBC 6.8 06/29/2023   HGB 13.4 06/29/2023   HCT 41.9 06/29/2023   MCV 94.4 06/29/2023   PLT 266  06/29/2023   Lab Results  Component Value Date   NA 138 06/29/2023   K 4.1 06/29/2023   CO2 22 06/29/2023   GLUCOSE 110 (H) 06/29/2023   BUN 20 06/29/2023   CREATININE 0.82 06/29/2023   BILITOT 0.6 06/29/2023   ALKPHOS 43 06/29/2023   AST 19 06/29/2023   ALT 18 06/29/2023   PROT 7.6 06/29/2023   ALBUMIN  4.0 06/29/2023   CALCIUM 9.7 06/29/2023   ANIONGAP 8 06/29/2023   GFR 70.70 08/09/2021   Lab Results  Component Value Date   CHOL 206 (H) 10/14/2022   Lab Results  Component Value Date   HDL 62 10/14/2022   Lab Results  Component Value Date    LDLCALC 124 (H) 10/14/2022   Lab Results  Component Value Date   TRIG 102 10/14/2022   Lab Results  Component Value Date   CHOLHDL 3.3 10/14/2022   No results found for: HGBA1C     Assessment & Plan:  Preventative health care Assessment & Plan: Ghm utd Check labs  See AVS  Health Maintenance  Topic Date Due   DEXA SCAN  01/01/2024   COVID-19 Vaccine (4 - 2025-26 season) 03/14/2024 (Originally 02/12/2024)   Influenza Vaccine  09/11/2027 (Originally 01/12/2024)   Medicare Annual Wellness (AWV)  12/12/2024   Mammogram  01/04/2025   DTaP/Tdap/Td (3 - Td or Tdap) 09/16/2025   Colonoscopy  12/20/2033   Pneumococcal Vaccine: 50+ Years  Completed   Hepatitis C Screening  Completed   Zoster Vaccines- Shingrix   Completed   HPV VACCINES  Aged Out   Meningococcal B Vaccine  Aged Out      Postmenopausal estrogen deficiency -     DG Bone Density; Future -     VITAMIN D  25 Hydroxy (Vit-D Deficiency, Fractures)  Acute pain of left knee Assessment & Plan: No pain presntly  Take tylenol  arthritis or IB  Ice daily as needed  Call or return to office as needed    Acute pain of right knee Assessment & Plan: No pain presntly  Take tylenol  arthritis or IB  Ice daily as needed  Call or return to office as needed    Other fatigue -     CBC with Differential/Platelet -     Comprehensive metabolic panel with GFR -     TSH -     Vitamin B12 -     VITAMIN D  25 Hydroxy (Vit-D Deficiency, Fractures)  Ischemic heart disease screen -     Comprehensive metabolic panel with GFR -     Lipid panel  Obesity (BMI 30-39.9) Assessment & Plan: Con't diet and exercise     Assessment and Plan Assessment & Plan Bilateral knee osteoarthritis   Intermittent bilateral knee pain worsens with prolonged sitting and wearing flat shoes, though there is no significant pain currently. Examination shows crepitus, indicating osteoarthritis. Symptoms are managed with over-the-counter analgesics and  lifestyle modifications. Recommend acetaminophen  or ibuprofen for pain management, icing knees at night if swelling occurs, and wearing supportive footwear. Consider referral to Sports Medicine if symptoms worsen.  Iron deficiency   Fatigue and low energy have improved with self-administered iron supplements, though no recent blood work confirms iron deficiency.  Adult wellness visit   During the routine adult wellness visit, there are no significant changes in family history. She retired last year and is not on any prescription medications, only taking vitamins and supplements. She maintains regular dental and eye check-ups but has not received flu or COVID vaccinations.  Shingles and pneumonia vaccinations are up to date.  Bone density screening   Bone density screening is due and will be coordinated with next year's mammogram appointment. Schedule bone density screening with next year's mammogram.   Jamee JONELLE Antonio Cyndee, DO

## 2024-02-27 NOTE — Assessment & Plan Note (Signed)
 Ghm utd Check labs  See AVS  Health Maintenance  Topic Date Due   DEXA SCAN  01/01/2024   COVID-19 Vaccine (4 - 2025-26 season) 03/14/2024 (Originally 02/12/2024)   Influenza Vaccine  09/11/2027 (Originally 01/12/2024)   Medicare Annual Wellness (AWV)  12/12/2024   Mammogram  01/04/2025   DTaP/Tdap/Td (3 - Td or Tdap) 09/16/2025   Colonoscopy  12/20/2033   Pneumococcal Vaccine: 50+ Years  Completed   Hepatitis C Screening  Completed   Zoster Vaccines- Shingrix   Completed   HPV VACCINES  Aged Out   Meningococcal B Vaccine  Aged Out

## 2024-03-01 ENCOUNTER — Ambulatory Visit: Payer: Self-pay | Admitting: Family Medicine

## 2024-04-17 ENCOUNTER — Telehealth: Payer: Self-pay | Admitting: *Deleted

## 2024-04-17 DIAGNOSIS — M25562 Pain in left knee: Secondary | ICD-10-CM

## 2024-04-17 DIAGNOSIS — M25561 Pain in right knee: Secondary | ICD-10-CM

## 2024-04-17 NOTE — Telephone Encounter (Signed)
 Review patients chart. Pt seen on 09/16 and discuss these concerns and referral was discuss. Referral placed

## 2024-04-17 NOTE — Telephone Encounter (Signed)
 Copied from CRM [id number]. Topic: Referral - Request for Referral >> Apr 17, 2024 12:12 PM Paige D wrote: Did the patient discuss referral with their provider in the last year? Yes (If No - schedule appointment) (If Yes - send message)  Appointment offered? No  Type of order/referral and detailed reason for visit: Knee Dr.   Preference of office, provider, location: open to PCP choice   If referral order, have you been seen by this specialty before? No (If Yes, this issue or another issue? When? Where?  Can we respond through MyChart? Yes  Please call pt with status update

## 2024-04-30 ENCOUNTER — Ambulatory Visit

## 2024-04-30 ENCOUNTER — Telehealth: Payer: Self-pay

## 2024-04-30 ENCOUNTER — Ambulatory Visit: Admitting: Family Medicine

## 2024-04-30 ENCOUNTER — Encounter: Payer: Self-pay | Admitting: Family Medicine

## 2024-04-30 ENCOUNTER — Other Ambulatory Visit: Payer: Self-pay

## 2024-04-30 VITALS — BP 130/82 | HR 73 | Ht 60.0 in | Wt 179.0 lb

## 2024-04-30 DIAGNOSIS — G8929 Other chronic pain: Secondary | ICD-10-CM

## 2024-04-30 DIAGNOSIS — M25562 Pain in left knee: Secondary | ICD-10-CM | POA: Diagnosis not present

## 2024-04-30 DIAGNOSIS — M25561 Pain in right knee: Secondary | ICD-10-CM

## 2024-04-30 NOTE — Telephone Encounter (Signed)
 Elizabeth [name] says to let you know she feels like she has fluid and swelling in her legs just above her knees.  Is there anything she can take or do to alleviate that?

## 2024-04-30 NOTE — Progress Notes (Signed)
   I, Leotis Batter, CMA acting as a scribe for Artist Lloyd, MD.  Elizabeth [name] is a 68 y.o. female who presents to [facility] today for exacerbation of bilat knee pain, L>R, x 2-3 weeks. Pt locates pain to medial aspect of the knees, throbbing at times.   Knee swelling: B LE Mechanical symptoms: B LE Aggravates: ambulation, causing night disturbance.  Treatments tried: Tylenol , IBU, heat, Vick's  Pertinent review of systems: No fevers or chills  Relevant historical information: History of endometrial cancer status post hysterectomy January 2025   Exam:  BP 130/82   Pulse 73   Ht 5' (1.524 m)   Wt 179 lb (81.2 kg)   SpO2 100%   BMI 34.96 kg/m  General: Well Developed, well nourished, and in no acute distress.   MSK: Knees bilaterally normal-appearing Tender palpation medial joint line. Normal motion. Intact strength. Mild laxity to MCL stress test.    Lab and Radiology Results  X-ray images bilateral knees obtained today personally and independently interpreted.  Right knee: Mild DJD predominantly medial.  No acute fractures.  Left knee: Mild edema.  No acute fractures.  Await formal radiology review   Assessment and Plan: 68 y.o. female with bilateral knee pain due to DJD.  Plan for Voltaren gel, Tylenol , sparing use of oral ibuprofen and most importantly physical therapy. If not improved consider steroid injection.  Of note she has a bit of needle phobia so would like to avoid injections if possible.   PDMP not reviewed this encounter. Orders Placed This Encounter  Procedures   DG Knee AP/LAT W/Sunrise Right    Standing Status:   Future    Number of Occurrences:   1    Expiration Date:   05/30/2024    Reason for Exam (SYMPTOM  OR DIAGNOSIS REQUIRED):   bilat knee pain    Preferred imaging location?:   [facility]   DG Knee AP/LAT W/Sunrise Left    Standing Status:   Future    Number of Occurrences:   1     Expiration Date:   05/30/2024    Reason for Exam (SYMPTOM  OR DIAGNOSIS REQUIRED):   bilat knee pain    Preferred imaging location?:   [facility]   Ambulatory referral to Physical Therapy    Referral Priority:   Routine    Referral Type:   Physical Medicine    Referral Reason:   Specialty Services Required    Requested Specialty:   Physical Therapy    Number of Visits Requested:   1   No orders of the defined types were placed in this encounter.    Discussed warning signs or symptoms. Please see discharge instructions. Patient expresses understanding.   The above documentation has been reviewed and is accurate and complete Artist Lloyd, M.D.

## 2024-04-30 NOTE — Patient Instructions (Addendum)
 Thank you for coming in today.   Please get an Xray today before you leave   A referral for physical therapy has been submitted. A representative from the physical therapy office will contact you to coordinate scheduling after confirming your benefits with your insurance provider. If you do not hear from the physical therapy office within the next 1-2 weeks, please let us  know.   Please use Voltaren gel (Generic Diclofenac Gel) up to 4x daily for pain as needed.  This is available over-the-counter as both the name brand Voltaren gel and the generic diclofenac gel.   See you back as needed.

## 2024-04-30 NOTE — Telephone Encounter (Signed)
 Dr. Joane spoke with patient before she left the office.

## 2024-05-03 ENCOUNTER — Ambulatory Visit: Payer: Self-pay | Admitting: Family Medicine

## 2024-05-03 NOTE — Progress Notes (Signed)
 Right knee x-ray shows mild arthritis.

## 2024-05-03 NOTE — Progress Notes (Signed)
 Left knee x-ray shows mild arthritis.

## 2024-05-13 ENCOUNTER — Other Ambulatory Visit: Payer: Self-pay

## 2024-05-13 ENCOUNTER — Encounter: Payer: Self-pay | Admitting: Physical Therapy

## 2024-05-13 ENCOUNTER — Ambulatory Visit: Admitting: Physical Therapy

## 2024-05-13 DIAGNOSIS — M25562 Pain in left knee: Secondary | ICD-10-CM

## 2024-05-13 DIAGNOSIS — M25561 Pain in right knee: Secondary | ICD-10-CM

## 2024-05-13 DIAGNOSIS — M6281 Muscle weakness (generalized): Secondary | ICD-10-CM

## 2024-05-13 DIAGNOSIS — G8929 Other chronic pain: Secondary | ICD-10-CM

## 2024-05-13 NOTE — Patient Instructions (Signed)
 Access Code: [email or web address] URL: [email or web address] Date: 05/13/2024 Prepared by: Elaine Daring  Exercises - Bridge with Abduction and Resistance Loop  - 3-4 x weekly - 3 sets - 10 reps - Clamshell with Resistance  - 3-4 x weekly - 3 sets - 10 reps - Hooklying Straight Leg Raise  - 3-4 x weekly - 3 sets - 10 reps - Seated Long Arc Quad  - 3-4 x weekly - 3 sets - 10 reps - Sit to Stand Without Arm Support  - 3-4 x weekly - 3 sets - 10 reps - Single Leg Stance  - 3-4 x weekly - 3 reps - 30 seconds hold - Heel Raises with Counter Support  - 3-4 x weekly - 3 sets - 15 reps

## 2024-05-13 NOTE — Therapy (Signed)
 OUTPATIENT PHYSICAL THERAPY EVALUATION   Patient Name: Oneal Biglow MRN: [id number] DOB:[birth year], 68 y.o., female Today's Date: 05/13/2024   END OF SESSION:  PT End of Session - 05/13/24 1149     Visit Number 1    Number of Visits 9    Date for Recertification  07/08/24    Authorization Type [employer] MCR    Progress Note Due on Visit 10    PT Start Time 1148    PT Stop Time 1230    PT Time Calculation (min) 42 min    Activity Tolerance Patient tolerated treatment well    Behavior During Therapy WFL for tasks assessed/performed          Past Medical History:  Diagnosis Date   Acne    Eczema    Endometrial cancer (HCC)    Past Surgical History:  Procedure Laterality Date   DILATATION & CURETTAGE/HYSTEROSCOPY WITH MYOSURE N/A 05/26/2023   Procedure: DILATATION & CURETTAGE/HYSTEROSCOPY WITH MYOSURE;  Surgeon: Gorge Ade, MD;  Location: [facility];  Service: Gynecology;  Laterality: N/A;   ROBOTIC ASSISTED TOTAL HYSTERECTOMY WITH BILATERAL SALPINGO OOPHERECTOMY Bilateral 07/05/2023   Procedure: XI ROBOTIC ASSISTED TOTAL HYSTERECTOMY WITH BILATERAL SALPINGO OOPHORECTOMY WITH LAPAROTOMY, UTERUS GREATER THAN 250 GRAMS;  Surgeon: Viktoria Comer SAUNDERS, MD;  Location: [facility];  Service: Gynecology;  Laterality: Bilateral;   SENTINEL NODE BIOPSY N/A 07/05/2023   Procedure: SENTINEL NODE BIOPSY;  Surgeon: Viktoria Comer SAUNDERS, MD;  Location: [facility];  Service: Gynecology;  Laterality: N/A;   TUBAL LIGATION     Patient Active Problem List   Diagnosis Date Noted   Endometrial cancer (HCC) 07/05/2023   Bilateral chronic knee pain 10/14/2022   Skin rash 04/13/2022   Preventative health care 08/15/2021   Osteopenia 04/11/2018   Myalgia and myositis 08/25/2014   Dermatitis 08/25/2014   Influenza 08/25/2014   Obesity (BMI 30-39.9) 06/10/2013   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 She normally goes walking a couple days out of the week and this is keeping her from walking, her knees and legs will hurt real bad after walking. She also reports trouble with stairs sometimes. She denies any mechanism of injury, states they just gradually started hurting.   PERTINENT HISTORY: See PMH above  PAIN:  Are you having pain? Yes:  NPRS scale: 0/10 currently, pain up to 4-5/10 with walking Pain location: Bilateral knees (L>R) Pain description: Sore, it hurts Aggravating factors: Walking, stairs Relieving factors: Cream, OTC meds  PRECAUTIONS: None  RED FLAGS: None   WEIGHT BEARING RESTRICTIONS: No  FALLS:  Has patient fallen in last 6 months? No  PLOF: Independent  PATIENT GOALS: Walking for exercise without limitation   OBJECTIVE:  Note: Objective measures were completed at Evaluation unless otherwise noted. PATIENT SURVEYS:  PSFS: 7 Stairs: 7 Walking for about 45 min: 7  COGNITION: Overall cognitive status: Within functional limits for tasks assessed     SENSATION: WFL  EDEMA:  None noted  MUSCLE LENGTH: Hamstring grossly [facility]  POSTURE:   Grossly WFL  PALPATION: Tender bilateral medial joint line region  LOWER EXTREMITY ROM:  Active ROM Right eval Left eval  Hip flexion    Hip extension    Hip abduction    Hip adduction    Hip internal rotation    Hip external rotation    Knee flexion    Knee extension    Ankle dorsiflexion    Ankle plantarflexion    Ankle inversion    Ankle eversion     (Blank rows = not tested)  LOWER EXTREMITY MMT:  MMT  Right eval Left eval  Hip flexion 4 4  Hip extension 4- 4-  Hip abduction 3+ 3+  Hip adduction    Hip internal rotation    Hip external rotation    Knee flexion 5 5  Knee extension 4+ 4+  Ankle dorsiflexion    Ankle plantarflexion    Ankle inversion    Ankle eversion     (Blank rows = not tested)  FUNCTIONAL TESTS:  5 times sit to stand: 8 seconds SLS: right 21 seconds, left 12 seconds  GAIT: Assistive device utilized: None Level of assistance: Complete Independence Comments: Grossly WFL                                                                                                                               TREATMENT  [facility] Adult PT Treatment:                                                DATE: 05/13/2024 Bridge with green 2 x 10 Side clamshell with green 2 x 10 each SLR x 10 each LAQ x 10 each Sit to stand 2 x 10 SLS x 30 sec each Standing heel raises x 15  Discussed walking progression around track  PATIENT EDUCATION:  Education details: Exam findings, POC, HEP Person educated: Patient Education method: Explanation, Demonstration, Tactile cues, Verbal cues, and Handouts Education comprehension: verbalized understanding, returned demonstration, verbal cues required, tactile cues required, and needs further education  HOME EXERCISE PROGRAM: Access Code: [email or web address]    ASSESSMENT: CLINICAL IMPRESSION: Patient is a 68 y.o. female who was seen today for physical therapy evaluation and treatment for chronic bilateral knee pain that seems consistent with flare of medial knee OA. She reports doing much better today but still has trouble walking extended periods and with stairs. She does exhibit some strength deficits of her quads and hips, and difficulty with single leg stance.   OBJECTIVE IMPAIRMENTS: decreased activity tolerance, decreased strength, and pain.   ACTIVITY LIMITATIONS: sitting, squatting, stairs, and locomotion level  PARTICIPATION LIMITATIONS:  shopping and community activity  PERSONAL FACTORS: Fitness and Time since onset of injury/illness/exacerbation are also affecting patient's functional outcome.   REHAB POTENTIAL: Good  CLINICAL DECISION MAKING: Stable/uncomplicated  EVALUATION COMPLEXITY: Low   GOALS: Goals reviewed with patient? Yes  SHORT TERM GOALS: Target date: 06/10/2024  Patient will be I with initial HEP in order to progress with therapy. Baseline: HEP provided at eval Goal status: INITIAL  2.  Patient will report knee pain with walking >/= 45 min </= 2/10 in order to reduce functional limitations Baseline: 4-5/10 Goal status: INITIAL  LONG TERM GOALS: Target date: 07/08/2024  Patient will be I with final HEP to maintain progress from PT. Baseline: HEP provided at eval Goal status: INITIAL  2.  Patient will report PSFS >/= 9 in order to indicate improvement in their functional ability. Baseline: 7 Goal status: INITIAL  3.  Patient will demonstrate bilateral knee extension strength 5/5 MMT in order to improve walking tolerance Baseline: grossly 4+/5 MMT Goal status: INITIAL   PLAN: PT FREQUENCY: 1x/week  PT DURATION: 8 weeks  PLANNED INTERVENTIONS: 97164- PT Re-evaluation, 97110-Therapeutic exercises, 97530- Therapeutic activity, 97112- Neuromuscular re-education, 97535- Self Care, [id number]- Manual therapy, Patient/Family education, Balance training, Stair training, Taping, and Joint mobilization  PLAN FOR NEXT SESSION: Review HEP and progress PRN, continue progressing of knee and hip strength, balance training, stair training   Elaine Daring, PT, DPT, LAT, ATC 05/13/24  12:51 PM Phone: [phone] Fax: [phone]

## 2024-06-17 ENCOUNTER — Telehealth: Payer: Self-pay

## 2024-06-17 NOTE — Telephone Encounter (Signed)
 Elizabeth [name] called to cancel her upcoming appointment with Eleanor Epps NP on 1/13. She reports she has an appointment with Dr.Mody for her 6 month follow up. Aware to keep her 1 year appointment on 01/05/25

## 2024-06-25 ENCOUNTER — Ambulatory Visit: Admitting: Gynecologic Oncology

## 2024-12-18 ENCOUNTER — Ambulatory Visit

## 2025-01-07 ENCOUNTER — Ambulatory Visit: Admitting: Gynecologic Oncology
